# Patient Record
Sex: Female | Born: 1970 | Hispanic: Yes | Smoking: Former smoker
Health system: Southern US, Community
[De-identification: ages and names within clinical notes are randomized; demographics above are authoritative.]

## PROBLEM LIST (undated history)

## (undated) DIAGNOSIS — R519 Headache, unspecified: Secondary | ICD-10-CM

## (undated) DIAGNOSIS — D649 Anemia, unspecified: Secondary | ICD-10-CM

## (undated) DIAGNOSIS — R51 Headache: Secondary | ICD-10-CM

## (undated) DIAGNOSIS — I10 Essential (primary) hypertension: Secondary | ICD-10-CM

---

## 2001-04-03 ENCOUNTER — Emergency Department (HOSPITAL_COMMUNITY): Admission: EM | Admit: 2001-04-03 | Discharge: 2001-04-03 | Payer: Self-pay | Admitting: Emergency Medicine

## 2003-07-23 ENCOUNTER — Inpatient Hospital Stay (HOSPITAL_COMMUNITY): Admission: AD | Admit: 2003-07-23 | Discharge: 2003-07-25 | Payer: Self-pay | Admitting: Obstetrics

## 2004-01-27 ENCOUNTER — Inpatient Hospital Stay (HOSPITAL_COMMUNITY): Admission: AD | Admit: 2004-01-27 | Discharge: 2004-01-27 | Payer: Self-pay | Admitting: Obstetrics and Gynecology

## 2004-04-14 ENCOUNTER — Inpatient Hospital Stay (HOSPITAL_COMMUNITY): Admission: AD | Admit: 2004-04-14 | Discharge: 2004-04-14 | Payer: Self-pay | Admitting: Obstetrics & Gynecology

## 2004-07-23 ENCOUNTER — Inpatient Hospital Stay (HOSPITAL_COMMUNITY): Admission: AD | Admit: 2004-07-23 | Discharge: 2004-07-23 | Payer: Self-pay | Admitting: Obstetrics

## 2004-07-24 ENCOUNTER — Inpatient Hospital Stay (HOSPITAL_COMMUNITY): Admission: AD | Admit: 2004-07-24 | Discharge: 2004-07-30 | Payer: Self-pay | Admitting: *Deleted

## 2004-08-24 ENCOUNTER — Inpatient Hospital Stay (HOSPITAL_COMMUNITY): Admission: AD | Admit: 2004-08-24 | Discharge: 2004-08-27 | Payer: Self-pay | Admitting: Obstetrics

## 2005-05-22 ENCOUNTER — Encounter: Payer: Self-pay | Admitting: *Deleted

## 2005-05-22 ENCOUNTER — Inpatient Hospital Stay (HOSPITAL_COMMUNITY): Admission: AD | Admit: 2005-05-22 | Discharge: 2005-05-22 | Payer: Self-pay | Admitting: *Deleted

## 2005-08-23 ENCOUNTER — Ambulatory Visit (HOSPITAL_COMMUNITY): Admission: RE | Admit: 2005-08-23 | Discharge: 2005-08-23 | Payer: Self-pay | Admitting: *Deleted

## 2005-10-31 ENCOUNTER — Ambulatory Visit (HOSPITAL_COMMUNITY): Admission: RE | Admit: 2005-10-31 | Discharge: 2005-10-31 | Payer: Self-pay | Admitting: *Deleted

## 2006-01-02 ENCOUNTER — Ambulatory Visit: Payer: Self-pay | Admitting: Obstetrics and Gynecology

## 2006-01-02 ENCOUNTER — Inpatient Hospital Stay (HOSPITAL_COMMUNITY): Admission: AD | Admit: 2006-01-02 | Discharge: 2006-01-10 | Payer: Self-pay | Admitting: *Deleted

## 2006-01-02 ENCOUNTER — Encounter (INDEPENDENT_AMBULATORY_CARE_PROVIDER_SITE_OTHER): Payer: Self-pay | Admitting: *Deleted

## 2006-01-05 ENCOUNTER — Encounter (INDEPENDENT_AMBULATORY_CARE_PROVIDER_SITE_OTHER): Payer: Self-pay | Admitting: *Deleted

## 2006-01-15 ENCOUNTER — Ambulatory Visit: Payer: Self-pay | Admitting: Family Medicine

## 2006-01-22 ENCOUNTER — Ambulatory Visit: Payer: Self-pay | Admitting: Family Medicine

## 2006-01-25 ENCOUNTER — Ambulatory Visit: Payer: Self-pay | Admitting: Family Medicine

## 2006-01-30 ENCOUNTER — Ambulatory Visit: Payer: Self-pay | Admitting: Family Medicine

## 2006-02-01 ENCOUNTER — Ambulatory Visit: Payer: Self-pay | Admitting: Family Medicine

## 2006-02-08 ENCOUNTER — Ambulatory Visit: Payer: Self-pay | Admitting: Family Medicine

## 2006-02-14 ENCOUNTER — Ambulatory Visit: Payer: Self-pay | Admitting: Sports Medicine

## 2006-02-16 ENCOUNTER — Ambulatory Visit: Payer: Self-pay | Admitting: Family Medicine

## 2006-02-21 ENCOUNTER — Ambulatory Visit: Payer: Self-pay | Admitting: Sports Medicine

## 2006-03-09 ENCOUNTER — Ambulatory Visit: Payer: Self-pay

## 2006-03-27 ENCOUNTER — Ambulatory Visit: Payer: Self-pay | Admitting: Family Medicine

## 2006-04-02 ENCOUNTER — Encounter (INDEPENDENT_AMBULATORY_CARE_PROVIDER_SITE_OTHER): Payer: Self-pay | Admitting: *Deleted

## 2006-06-13 ENCOUNTER — Ambulatory Visit: Payer: Self-pay | Admitting: Sports Medicine

## 2006-07-12 ENCOUNTER — Ambulatory Visit: Payer: Self-pay | Admitting: Sports Medicine

## 2006-08-02 ENCOUNTER — Ambulatory Visit: Payer: Self-pay | Admitting: Family Medicine

## 2006-08-30 DIAGNOSIS — I80299 Phlebitis and thrombophlebitis of other deep vessels of unspecified lower extremity: Secondary | ICD-10-CM | POA: Insufficient documentation

## 2006-08-30 DIAGNOSIS — G4700 Insomnia, unspecified: Secondary | ICD-10-CM | POA: Insufficient documentation

## 2006-08-31 ENCOUNTER — Encounter (INDEPENDENT_AMBULATORY_CARE_PROVIDER_SITE_OTHER): Payer: Self-pay | Admitting: *Deleted

## 2006-10-16 ENCOUNTER — Ambulatory Visit: Payer: Self-pay | Admitting: Family Medicine

## 2006-10-16 DIAGNOSIS — F330 Major depressive disorder, recurrent, mild: Secondary | ICD-10-CM | POA: Insufficient documentation

## 2006-10-16 DIAGNOSIS — B351 Tinea unguium: Secondary | ICD-10-CM | POA: Insufficient documentation

## 2006-11-06 ENCOUNTER — Telehealth (INDEPENDENT_AMBULATORY_CARE_PROVIDER_SITE_OTHER): Payer: Self-pay | Admitting: Family Medicine

## 2006-11-09 ENCOUNTER — Telehealth (INDEPENDENT_AMBULATORY_CARE_PROVIDER_SITE_OTHER): Payer: Self-pay | Admitting: Family Medicine

## 2006-11-30 ENCOUNTER — Telehealth (INDEPENDENT_AMBULATORY_CARE_PROVIDER_SITE_OTHER): Payer: Self-pay | Admitting: Family Medicine

## 2006-12-06 ENCOUNTER — Ambulatory Visit: Payer: Self-pay | Admitting: Family Medicine

## 2006-12-06 LAB — CONVERTED CEMR LAB: KOH Prep: NEGATIVE

## 2006-12-12 ENCOUNTER — Encounter (INDEPENDENT_AMBULATORY_CARE_PROVIDER_SITE_OTHER): Payer: Self-pay | Admitting: Family Medicine

## 2006-12-12 ENCOUNTER — Ambulatory Visit (HOSPITAL_COMMUNITY): Admission: RE | Admit: 2006-12-12 | Discharge: 2006-12-12 | Payer: Self-pay | Admitting: *Deleted

## 2007-01-10 ENCOUNTER — Encounter (INDEPENDENT_AMBULATORY_CARE_PROVIDER_SITE_OTHER): Payer: Self-pay | Admitting: Family Medicine

## 2007-02-12 ENCOUNTER — Ambulatory Visit: Payer: Self-pay

## 2007-02-12 ENCOUNTER — Encounter (INDEPENDENT_AMBULATORY_CARE_PROVIDER_SITE_OTHER): Payer: Self-pay | Admitting: Family Medicine

## 2007-02-12 DIAGNOSIS — N912 Amenorrhea, unspecified: Secondary | ICD-10-CM | POA: Insufficient documentation

## 2007-02-12 LAB — CONVERTED CEMR LAB
Basophils Relative: 0 % (ref 0–1)
Beta hcg, urine, semiquantitative: POSITIVE
Eosinophils Absolute: 0 10*3/uL (ref 0.0–0.7)
Eosinophils Relative: 0 % (ref 0–5)
MCV: 88.7 fL (ref 78.0–100.0)
Monocytes Relative: 8 % (ref 3–11)
Neutro Abs: 3.8 10*3/uL (ref 1.7–7.7)
RBC: 4.32 M/uL (ref 3.87–5.11)
RDW: 13.7 % (ref 11.5–14.0)
Rh Type: POSITIVE
Rubella: 38.5 intl units/mL — ABNORMAL HIGH

## 2007-02-13 ENCOUNTER — Telehealth (INDEPENDENT_AMBULATORY_CARE_PROVIDER_SITE_OTHER): Payer: Self-pay | Admitting: *Deleted

## 2007-02-19 ENCOUNTER — Telehealth: Payer: Self-pay | Admitting: *Deleted

## 2007-02-20 ENCOUNTER — Telehealth: Payer: Self-pay | Admitting: *Deleted

## 2007-02-20 ENCOUNTER — Ambulatory Visit: Payer: Self-pay

## 2007-02-20 ENCOUNTER — Inpatient Hospital Stay (HOSPITAL_COMMUNITY): Admission: AD | Admit: 2007-02-20 | Discharge: 2007-02-20 | Payer: Self-pay | Admitting: Family Medicine

## 2007-02-20 DIAGNOSIS — O4690 Antepartum hemorrhage, unspecified, unspecified trimester: Secondary | ICD-10-CM | POA: Insufficient documentation

## 2007-02-22 ENCOUNTER — Inpatient Hospital Stay (HOSPITAL_COMMUNITY): Admission: AD | Admit: 2007-02-22 | Discharge: 2007-02-22 | Payer: Self-pay | Admitting: Obstetrics and Gynecology

## 2007-02-24 ENCOUNTER — Inpatient Hospital Stay (HOSPITAL_COMMUNITY): Admission: AD | Admit: 2007-02-24 | Discharge: 2007-02-24 | Payer: Self-pay | Admitting: Obstetrics & Gynecology

## 2007-03-02 ENCOUNTER — Inpatient Hospital Stay (HOSPITAL_COMMUNITY): Admission: AD | Admit: 2007-03-02 | Discharge: 2007-03-02 | Payer: Self-pay | Admitting: Family Medicine

## 2007-07-09 ENCOUNTER — Ambulatory Visit: Payer: Self-pay | Admitting: Family Medicine

## 2007-07-09 DIAGNOSIS — K029 Dental caries, unspecified: Secondary | ICD-10-CM | POA: Insufficient documentation

## 2007-09-06 ENCOUNTER — Encounter (INDEPENDENT_AMBULATORY_CARE_PROVIDER_SITE_OTHER): Payer: Self-pay | Admitting: Family Medicine

## 2008-01-20 ENCOUNTER — Emergency Department (HOSPITAL_COMMUNITY): Admission: EM | Admit: 2008-01-20 | Discharge: 2008-01-20 | Payer: Self-pay | Admitting: Emergency Medicine

## 2010-08-19 ENCOUNTER — Encounter: Payer: Self-pay | Admitting: *Deleted

## 2010-11-18 NOTE — Discharge Summary (Signed)
NAMEKIMMERLY, Angela Ruiz      ACCOUNT NO.:  [id number]   MEDICAL RECORD NO.:  [id number]          PATIENT TYPE:  INP   LOCATION:  9119                          FACILITY:  [facility]   PHYSICIAN:  Phil D. Okey Dupre, M.D.     DATE OF BIRTH:  1971-06-14   DATE OF ADMISSION:  01/02/2006  DATE OF DISCHARGE:  01/10/2006                                 DISCHARGE SUMMARY   This is a 40 year old, G7, P 6-0-1-6, female who presented at 6 and 4 days  gestational age on January 02, 2006.  She presented to the MAU, citing leakage  of fluid as the reason she came.  It was noted on speculum exam that she was  positive for pooling and ferning.  It was also noted she was fingertip and  50 on digital cervical exam.  The patient was noted to have, at this point  in her pregnancy, a malpresentation of a transverse lie.  Due to this  transverse lie, she was taken to the OR for a C-section.  The C-section was  performed on January 02, 2006, by Carolanne Grumbling, M.D., with attending surgeon  Dr. Kristen Loader.  It was a repeat low transverse cesarean section for a  malpresentation with baby in transverse lie, previous spontaneous rupture of  membranes.  This female also had two previous C-sections, followed by three  successful vaginal births after previous C-section.  Please see operative  note for more information on the C-section.  This patient, after the  section, produced a female infant in transverse lie, Apgars 8 and 9, cord pH  of 7.25, no complications.  On postoperative day number three, I was  notified by the nurse that the patient had a new onset murmur and leg pain.  Upon examining the patient, I did appreciate a heart murmur, however it was  most likely a normal physiological murmur.  However, this patient, on  physical exam, did have right lower extremity tenderness.  There was no  erythema, no warmth, and minor swelling of her left foot.  The following day  the tenderness increased, as did the swelling, and on  January 05, 2006, a  Doppler ultrasound was performed.  I was notified by vascular lab that there  was indeed a right lower extremity DVT noted in the posterior tibial vein.  All other veins were patent.  At this time we were notified of the DVT.  The  patient was begun on Lovenox and Coumadin per the pharmacy protocol; Lovenox  70 mg subcutaneously b.i.d. and Coumadin 5 mg p.o. daily.  We also began  tracking the patient's INR.  The initial INR of the patient was 1.2, on January 08, 2006, which was the first check of her INR.  On January 09, 2006, INR had  increased to 1.8.  At this point, pharmacy increased the Coumadin to 7.5 mg  p.o. daily.  On January 10, 2006, the patient had a therapeutic INR of 2.2,  with therapeutic levels being between 2 and 3.  At this time, the patient's  calf tenderness had subsided.  In addition, during this stay she complained  of severe  abdominal pain, which has also decreased over the course.  The  patient will be set up in Coumadin clinic pending the return of Dr. Michaell Cowing,  who is in chart of that clinic.  In the meantime, the patient will return on  Friday, January 12, 2006, to the MAU, to have an INR check.  She was given a  prescription with that order written on it, and will present on Friday to  the MAU.  On that order, there is a note that I, Dr. Karn Pickler, will be  notified of her INR level at that time.  If the INR is nontherapeutic, above  or below the 2-3 range, I will contact pharmacy, and we will adjust her  Coumadin dose as needed.  On Monday, January 15, 2006, the patient will follow  up with me, Dr. Karn Pickler, at the [facility].  Again, her INR will  be checked, and her Coumadin dose will be adjusted as needed.  This will  establish her a primary care physician.  From there we hopefully will set  her up with Coumadin clinic where she will be followed with her INR levels.  Ms. Melba Coon will also follow up with [facility] for a typical 6-week  follow up  appointment.  She is in the process of deciding if she can get a  payment plan for a BTL.  If not, she will have an IUD placed at that 6-week  follow up appointment at [facility].  I, Dr. Karn Pickler, will be paged with  the results as the patient waits for them.  The patient was discharged on  Wednesday, January 10, 2006, with a prescription for Coumadin 5 mg p.o. daily,  Percocet 325/5 one pill q.4-6 hours p.r.n. pain, and also Colace and  prenatal vitamins.  She was instructed to call if she has more leg pain  after discharge.  The patient's hemoglobin on discharge is 11.5.  She is  breast and bottle feeding.  Please note that, also during the stay, after  the DVT was found on duplex ultrasound, the patient did have a CT scan of  her chest to rule out a pulmonary embolism.  That CT was negative for any  kind of pulmonary embolism.  The patient did not have any more symptoms of a  PE, and upon discharge she was feeling well.  In conclusion, this patient  was discharged with a diagnosis of postoperative day number three DVT.  Also, she  had a C-section.  Please note the dictation did not pick up that her follow  up appointment with me is on January 15, 2006, Monday, at 3:30 p.m., with me,  Dr. Karn Pickler.  Please that the C-section was with no complications.  It was a  little baby boy.     ______________________________  Johney Maine, M.D.    ______________________________  Javier Glazier. Okey Dupre, M.D.    JT/MEDQ  D:  01/10/2006  T:  01/10/2006  Job:  [id number]   cc:   Michele Mcalpine D. Okey Dupre, M.D.

## 2010-11-18 NOTE — Discharge Summary (Signed)
Angela Ruiz, Angela Ruiz NO.:  [id number]   MEDICAL RECORD NO.:  [id number]          PATIENT TYPE:  INP   LOCATION:  9149                          FACILITY:  [facility]   PHYSICIAN:  Roseanna Rainbow, M.D.DATE OF BIRTH:  1970-12-23   DATE OF ADMISSION:  07/24/2004  DATE OF DISCHARGE:  07/30/2004                                 DISCHARGE SUMMARY   CHIEF COMPLAINT:  The patient is a 40 year old gravida 6, para 4-0-1-4, with  an estimated date of confinement of August 15, 2004, who complains of  fever.   HISTORY OF PRESENT ILLNESS:  The patient was recently treated one day prior  to presentation for a urinary tract infection with Macrobid.   PHYSICAL EXAMINATION:  VITAL SIGNS:  TMAX 101.7, pulse 150, fetal heart rate  190.  BACK:  Right costovertebral angle tenderness.  HEENT:  Normocephalic and atraumatic.  LUNGS:  Clear to auscultation bilaterally.  HEART:  Regular rate and rhythm without murmurs, rubs, or gallops.  ABDOMEN:  The fundal height is 36 cm.  EXTREMITIES:  No cyanosis, clubbing, or edema.   ASSESSMENT:  Intrauterine pregnancy at 35+ weeks with pyelonephritis.   PLAN:  Admission, parenteral antibiotics, supportive measures.   HOSPITAL COURSE:  The patient was admitted and she was started on parenteral  antibiotics.  Blood cultures initially grew out gram negative rods.  She was  felt to have urosepsis.  Infectious disease was consulted as well as  critical care.  The final culture and sensitivity for the blood cultures  were Escherichia coli sensitive to the ceftriaxone that the patient was  receiving for treatment.  Her hemoglobin was 8.8.  Chest x-ray with findings  consistent with a hazy pneumonia.  Renal ultrasound was normal.  At this  point, she was felt to have urosepsis complicated by pulmonary edema.  However, she was not felt to be in any respiratory distress.  Her oxygen  saturations remained greater than 99% on 2 liters nasal cannula.  Clinically, she improved.  She was subsequently discharged to home.   DISCHARGE DIAGNOSES:  1.  Intrauterine pregnancy at 35+ weeks.  2.  Urosepsis.   CONDITION ON DISCHARGE:  Stable.   DIET:  Regular.   ACTIVITY:  No strenuous activity.   DISCHARGE MEDICATIONS:  Ceftin.   DISPOSITION:  The patient is to follow up in the office in several days.      LAJ/MEDQ  D:  08/23/2004  T:  08/23/2004  Job:  [id number]

## 2010-11-18 NOTE — Op Note (Signed)
Angela Ruiz, Angela Ruiz      ACCOUNT NO.:  [id number]   MEDICAL RECORD NO.:  [id number]          PATIENT TYPE:  INP   LOCATION:  9119                          [facility]ACILITY:  [facility]   PHYSICIAN:  Tracy L. Mayford Knife, M.D.DATE OF BIRTH:  March 31, 1971   DATE OF PROCEDURE:  DATE OF DISCHARGE:                                 OPERATIVE REPORT   PREOPERATIVE DIAGNOSES:  1.  A 39-week 4-day intrauterine pregnancy.  2.  Malpresentation with baby with transverse lie.  3.  Spontaneous rupture of membranes.  4.  History of two previous cesarean sections followed by three successful      vaginal births after previous cesarean section.   POSTOPERATIVE DIAGNOSES:  1.  A 39-week and 4-day intrauterine pregnancy.  2.  Malpresentation with baby with transverse lie.  3.  Spontaneous rupture of membranes.  4.  History of two previous cesarean sections followed by three successful      vaginal births after previous cesarean section.   PROCEDURE:  Repeat low transverse cesarean section via Pfannenstiel.   SURGEON:  Javier Glazier. Okey Dupre, M.D.   ASSISTANT:  Marc Morgans. Mayford Knife, M.D.   ANESTHESIA:  Spinal.   COMPLICATIONS:  None.   ESTIMATED BLOOD LOSS:  600 mL.   FLUIDS:  2000 mL of lactated Ringer's.   URINE OUTPUT:  400 mL clear urine.   INDICATIONS:  The patient is a 40 year old G7, P5-0-1-5, at 39 weeks 4 days  well-healed presented with spontaneous rupture of membranes.  Baby was noted  to not be cephalic.  On exam, the baby was found to be in transverse lie  with the head on the maternal right.  The patient was counseled about the  risks and benefits of cesarean section.  She wanted to proceed.   FINDINGS:  Female infant in the transverse lie with abdomen down and head to  the maternal right.  Apgars 8 and 9.  Cord pH 7.25.  Normal uterus, tubes,  and ovaries.   PROCEDURE:  The patient was taken to the operating room where spinal  anesthesia was placed and found to be adequate.  She was then prepped  and  draped in normal sterile fashion in the dorsal supine position with a  leftward tilt.  A Pfannenstiel skin incision was then made with the scalpel  and carried through to the underlying layer of fascia.  Fascia was incised  in the midline and the incision extended laterally with the Mayo scissors.  Inferior aspect of the fascial incision was then grasped with Kocher clamps,  elevated, and underlying rectus muscles dissected off bluntly.  Attention  was then turned to the superior aspect of the incision which, in a similar  fashion, was grasped, tented up with Kocher clamps, and the rectus muscles  dissected off bluntly.  Rectus muscles were then separated in the midline  and the peritoneum identified, tented up, and entered with the Metzenbaum  scissors.  Peritoneal incision was extended superiorly and inferiorly with  good visualization of the bladder.  Bladder blade was then inserted and the  vesicouterine peritoneum identified, grasped with pickups, and entered  sharply with the Metzenbaum scissors.  Incision  was extended laterally and  the bladder flap created digitally.  Bladder blade was then reinserted in  the lower uterine segment and incised in a transverse fashion with the  scalpel.  The uterine incision was extended laterally manually.  Bladder  blade was removed and the infant initially presented with a transverse lie  with the abdomen down and to the right.  Ultimately, a breech extraction was  performed (double footling).  Both feet were removed and then the body  followed by ultimately the infant's head which was also delivered  atraumatically.  The nose and mouth were suctioned and the cord clamped and  cut.  The infant was handed off to the waiting neonatologist.  Cord gases  were sent.  Uterus was not exteriorized but it was cleared of all clot and  debris.  Uterine incision was repaired with 0 Vicryl in running locked  fashion.  There was bleeding from the left  corner so additional 0 Vicryl was  used to obtain excellent hemostasis.  Gutters were cleared of all clot.  The  fascia was reapproximated with 0 Vicryl in a running fashion.  The skin was  closed with staples.   The patient tolerated the procedure well.  Sponge, lap and needle counts  were correct x 2.  The patient was taken to the recovery room in stable  condition.           ______________________________  Marc Morgans Mayford Knife, M.D.     TLW/MEDQ  D:  01/02/2006  T:  01/02/2006  Job:  [id number]

## 2011-01-26 ENCOUNTER — Emergency Department (HOSPITAL_COMMUNITY)
Admission: EM | Admit: 2011-01-26 | Discharge: 2011-01-26 | Disposition: A | Discharge status: Home / Self Care | Payer: Self-pay | Attending: Emergency Medicine | Admitting: Emergency Medicine

## 2011-01-26 DIAGNOSIS — R6884 Jaw pain: Secondary | ICD-10-CM | POA: Insufficient documentation

## 2011-01-26 DIAGNOSIS — H9209 Otalgia, unspecified ear: Secondary | ICD-10-CM | POA: Insufficient documentation

## 2011-01-26 DIAGNOSIS — R51 Headache: Secondary | ICD-10-CM | POA: Insufficient documentation

## 2011-01-26 DIAGNOSIS — K089 Disorder of teeth and supporting structures, unspecified: Secondary | ICD-10-CM | POA: Insufficient documentation

## 2011-04-14 LAB — DIFFERENTIAL
Basophils Absolute: 0.1
Monocytes Absolute: 0.5
Monocytes Relative: 6

## 2011-04-14 LAB — URINALYSIS, ROUTINE W REFLEX MICROSCOPIC
Bilirubin Urine: NEGATIVE
Nitrite: NEGATIVE
Protein, ur: NEGATIVE
Urobilinogen, UA: 0.2

## 2011-04-14 LAB — ABO/RH: ABO/RH(D): A POS

## 2011-04-14 LAB — GC/CHLAMYDIA PROBE AMP, GENITAL: GC Probe Amp, Genital: NEGATIVE

## 2011-04-14 LAB — CBC
HCT: 37.8
Hemoglobin: 13.1
MCHC: 34.7
MCV: 86.8

## 2011-04-14 LAB — URINE MICROSCOPIC-ADD ON

## 2011-04-14 LAB — URINE CULTURE

## 2011-04-14 LAB — WET PREP, GENITAL: Yeast Wet Prep HPF POC: NONE SEEN

## 2013-06-13 ENCOUNTER — Encounter (INDEPENDENT_AMBULATORY_CARE_PROVIDER_SITE_OTHER): Payer: Self-pay

## 2013-06-13 ENCOUNTER — Encounter: Payer: Self-pay | Admitting: Internal Medicine

## 2013-06-13 ENCOUNTER — Ambulatory Visit: Payer: Self-pay | Attending: Internal Medicine | Admitting: Internal Medicine

## 2013-06-13 VITALS — BP 133/90 | HR 70 | Temp 98.7°F | Resp 14 | Ht 60.0 in | Wt 159.0 lb

## 2013-06-13 DIAGNOSIS — N920 Excessive and frequent menstruation with regular cycle: Secondary | ICD-10-CM | POA: Insufficient documentation

## 2013-06-13 LAB — LIPID PANEL
Cholesterol: 198 mg/dL (ref 0–200)
HDL: 59 mg/dL (ref >39)
LDL Cholesterol: 107 mg/dL — ABNORMAL HIGH (ref 0–99)
Triglycerides: 161 mg/dL — ABNORMAL HIGH (ref <150)

## 2013-06-13 LAB — ANEMIA PANEL
Folate: >20 ng/mL
RBC.: 4.69 MIL/uL (ref 3.87–5.11)
Retic Ct Pct: 0.8 % (ref 0.4–2.3)
TIBC: 542 ug/dL — ABNORMAL HIGH (ref 250–470)
UIBC: 529 ug/dL — ABNORMAL HIGH (ref 125–400)
Vitamin B-12: 525 pg/mL (ref 211–911)

## 2013-06-13 LAB — CBC WITH DIFFERENTIAL/PLATELET
Basophils Relative: 1 % (ref 0–1)
Eosinophils Absolute: 0 10*3/uL (ref 0.0–0.7)
Eosinophils Relative: 0 % (ref 0–5)
Hemoglobin: 8.6 g/dL — ABNORMAL LOW (ref 12.0–15.0)
Lymphocytes Relative: 42 % (ref 12–46)
MCH: 18.3 pg — ABNORMAL LOW (ref 26.0–34.0)
Monocytes Absolute: 0.5 10*3/uL (ref 0.1–1.0)
Neutro Abs: 4.1 10*3/uL (ref 1.7–7.7)
Platelets: 445 10*3/uL — ABNORMAL HIGH (ref 150–400)
RDW: 18.9 % — ABNORMAL HIGH (ref 11.5–15.5)

## 2013-06-13 LAB — COMPLETE METABOLIC PANEL WITH GFR
Albumin: 4.5 g/dL (ref 3.5–5.2)
Alkaline Phosphatase: 61 U/L (ref 39–117)
Calcium: 9.1 mg/dL (ref 8.4–10.5)
Creat: 0.55 mg/dL (ref 0.50–1.10)
Glucose, Bld: 99 mg/dL (ref 70–99)
Potassium: 4.6 mEq/L (ref 3.5–5.3)
Sodium: 139 mEq/L (ref 135–145)

## 2013-06-13 NOTE — Progress Notes (Signed)
Pt is here to establish care. Complains that menstrual period lasts 20 days; abdominal pain, bloating, heavy bleeding on first 3-4 days. Pian occuring x3 weeks. Negative pregnancy test.

## 2013-06-13 NOTE — Progress Notes (Signed)
Patient ID: Angela Ruiz, female   DOB: 1970-11-06, 42 y.o.   MRN: [id number]   CC:  HPI: 42 year old female, here to establish care. The patient has a history of DVT after her last pregnancy 7 years ago. She was placed on Lovenox and Coumadin for this. She describes heavy menstrual periods her last period lasted 20 days. The patient also complains of pelvic pain and dysmenorrhea especially during her periods. She also complains of bilateral lower quadrant pain. Her bowel movements are regular. She denies any chest pain or shortness of breath In the morning when she wakes up she is a little dizzy. Last night she had a headache She does not typically have a history of migraines    Not on File History reviewed. No pertinent past medical history. Current Outpatient Prescriptions on File Prior to Visit  Medication Sig Dispense Refill  . cephALEXin (KEFLEX) 500 MG capsule Take 500 mg by mouth 2 (two) times daily. For seven days       . guaiFENesin-dextromethorphan (ROBITUSSIN DM) 100-10 MG/5ML syrup Take 5 mLs by mouth. (one teaspoon) every 4-6 hours as needed for cough       . oxymetazoline (AFRIN) 0.05 % nasal spray 2 sprays by Nasal route 3 (three) times daily. For 3 days       . terbinafine (LAMISIL) 250 MG tablet Take 250 mg by mouth daily.         No current facility-administered medications on file prior to visit.   History reviewed. No pertinent family history. History   Social History  . Marital Status: Single    Spouse Name: N/A    Number of Children: N/A  . Years of Education: N/A   Occupational History  . Not on file.   Social History Main Topics  . Smoking status: Never Smoker   . Smokeless tobacco: Not on file  . Alcohol Use: No  . Drug Use: No  . Sexual Activity: Not on file   Other Topics Concern  . Not on file   Social History Narrative  . No narrative on file    Review of Systems  Constitutional: Negative for fever, chills, diaphoresis,  activity change, appetite change and fatigue.  HENT: Negative for ear pain, nosebleeds, congestion, facial swelling, rhinorrhea, neck pain, neck stiffness and ear discharge.   Eyes: Negative for pain, discharge, redness, itching and visual disturbance.  Respiratory: Negative for cough, choking, chest tightness, shortness of breath, wheezing and stridor.   Cardiovascular: Negative for chest pain, palpitations and leg swelling.  Gastrointestinal: Negative for abdominal distention.  Genitourinary: Negative for dysuria, urgency, frequency, hematuria, flank pain, decreased urine volume, difficulty urinating and dyspareunia.  Musculoskeletal: Negative for back pain, joint swelling, arthralgias and gait problem.  Neurological: Negative for dizziness, tremors, seizures, syncope, facial asymmetry, speech difficulty, weakness, light-headedness, numbness and headaches.  Hematological: Negative for adenopathy. Does not bruise/bleed easily.  Psychiatric/Behavioral: Negative for hallucinations, behavioral problems, confusion, dysphoric mood, decreased concentration and agitation.    Objective:   Filed Vitals:   06/13/13 0909  BP: 133/90  Pulse: 70  Temp: 98.7 F (37.1 C)  Resp: 14    Physical Exam  Constitutional: Appears well-developed and well-nourished. No distress.  HENT: Normocephalic. External right and left ear normal. Oropharynx is clear and moist.  Eyes: Conjunctivae and EOM are normal. PERRLA, no scleral icterus.  Neck: Normal ROM. Neck supple. No JVD. No tracheal deviation. No thyromegaly.  CVS: RRR, S1/S2 +, no murmurs, no gallops, no carotid bruit.  Pulmonary:  Effort and breath sounds normal, no stridor, rhonchi, wheezes, rales.  Abdominal: Soft. BS +,  no distension, tenderness, rebound or guarding.  Musculoskeletal: Normal range of motion. No edema and no tenderness.  Lymphadenopathy: No lymphadenopathy noted, cervical, inguinal. Neuro: Alert. Normal reflexes, muscle tone  coordination. No cranial nerve deficit. Skin: Skin is warm and dry. No rash noted. Not diaphoretic. No erythema. No pallor.  Psychiatric: Normal mood and affect. Behavior, judgment, thought content normal.   Lab Results  Component Value Date   WBC 7.4 02/20/2007   HGB 13.1 02/20/2007   HCT 37.8 02/20/2007   MCV 86.8 02/20/2007   PLT 308 02/20/2007   No results found for this basename: CREATININE, BUN, NA, K, CL, CO2    No results found for this basename: HGBA1C   Lipid Panel  No results found for this basename: chol, trig, hdl, cholhdl, vldl, ldlcalc       Assessment and plan:   Patient Active Problem List   Diagnosis Date Noted  . DENTAL CARIES 07/09/2007  . VAGINAL BLEEDING, FIRST TRIMESTER 02/20/2007  . AMENORRHEA 02/12/2007  . ONYCHOMYCOSIS, TOENAILS 10/16/2006  . DEPRESSIVE DISORDER, MAJOR, RCR, MILD 10/16/2006  . DEEP VEIN THROMBOPHLEBITIS, LEG 08/30/2006  . INSOMNIA NOS 08/30/2006     Menorrhagia Irregular periods for the last one year, now increasing in duration Dizziness could be because of anemia We'll check the patient's hemoglobin Pelvic ultrasound Gynecologic referral  Establish care Patient is refusing flu vaccine and she is allergic to eggs. She has not had a recent Pap smear Screening labs, CMP, CBC, and lipid panel Followup in one month for pelvic pain and heavy menstrual bleeding             The patient was given clear instructions to go to ER or return to medical center if symptoms don't improve, worsen or new problems develop. The patient verbalized understanding. The patient was told to call to get any lab results if not heard anything in the next week.

## 2013-06-14 LAB — VITAMIN D 25 HYDROXY (VIT D DEFICIENCY, FRACTURES): Vit D, 25-Hydroxy: 21 ng/mL — ABNORMAL LOW (ref 30–89)

## 2013-06-17 ENCOUNTER — Encounter: Payer: Self-pay | Admitting: Nurse Practitioner

## 2013-06-17 ENCOUNTER — Telehealth: Payer: Self-pay | Admitting: *Deleted

## 2013-06-17 MED ORDER — FERROUS SULFATE 325 (65 FE) MG PO TABS: 325.0000 mg | ORAL_TABLET | Freq: Three times a day (TID) | ORAL | Status: DC

## 2013-06-17 MED ORDER — VITAMIN D (ERGOCALCIFEROL) 1.25 MG (50000 UNIT) PO CAPS: 50000.0000 [IU] | ORAL_CAPSULE | ORAL | Status: DC

## 2013-06-17 NOTE — Telephone Encounter (Signed)
Contacted pt to inform her urgently that her hemoglobin is 8.6, 6 years ago her hemoglobin was 13.1. She is also extremely iron deficient. Prescription has been called in for ferrous sulfate. She also has a very low vitamin D level. Prescription called in for vitamin D [facility]. He scheduled the patient for repeat CBC in one month. And a followup visit in the clinic in one month. Spoke with her son Wynona Canes because pt does not speak English effectively. Call completed effectiviely through pt's son.

## 2013-06-17 NOTE — Addendum Note (Signed)
Addended by: Susie Cassette MD, Germain Osgood on: 06/17/2013 04:22 PM   Modules accepted: Orders

## 2013-06-19 ENCOUNTER — Ambulatory Visit (HOSPITAL_COMMUNITY): Payer: Self-pay

## 2013-06-30 ENCOUNTER — Other Ambulatory Visit: Payer: Self-pay | Admitting: Internal Medicine

## 2013-06-30 ENCOUNTER — Ambulatory Visit (HOSPITAL_COMMUNITY)
Admission: RE | Admit: 2013-06-30 | Discharge: 2013-06-30 | Disposition: A | Discharge status: Home / Self Care | Payer: Self-pay | Source: Ambulatory Visit | Attending: Internal Medicine | Admitting: Internal Medicine

## 2013-06-30 DIAGNOSIS — R109 Unspecified abdominal pain: Secondary | ICD-10-CM | POA: Insufficient documentation

## 2013-06-30 DIAGNOSIS — N920 Excessive and frequent menstruation with regular cycle: Secondary | ICD-10-CM

## 2013-06-30 DIAGNOSIS — D250 Submucous leiomyoma of uterus: Secondary | ICD-10-CM | POA: Insufficient documentation

## 2013-07-14 ENCOUNTER — Encounter: Payer: Self-pay | Admitting: Obstetrics & Gynecology

## 2013-07-14 ENCOUNTER — Ambulatory Visit: Payer: Self-pay | Attending: Internal Medicine | Admitting: Internal Medicine

## 2013-07-14 ENCOUNTER — Encounter: Payer: Self-pay | Admitting: Nurse Practitioner

## 2013-07-14 VITALS — BP 120/57 | HR 81 | Temp 98.7°F | Resp 14 | Ht 60.0 in | Wt 163.8 lb

## 2013-07-14 DIAGNOSIS — D62 Acute posthemorrhagic anemia: Secondary | ICD-10-CM

## 2013-07-14 DIAGNOSIS — R300 Dysuria: Secondary | ICD-10-CM | POA: Insufficient documentation

## 2013-07-14 DIAGNOSIS — D649 Anemia, unspecified: Secondary | ICD-10-CM | POA: Insufficient documentation

## 2013-07-14 DIAGNOSIS — N920 Excessive and frequent menstruation with regular cycle: Secondary | ICD-10-CM | POA: Insufficient documentation

## 2013-07-14 LAB — CBC WITH DIFFERENTIAL/PLATELET
BASOS PCT: 1 % (ref 0–1)
Basophils Absolute: 0 10*3/uL (ref 0.0–0.1)
EOS ABS: 0 10*3/uL (ref 0.0–0.7)
EOS PCT: 1 % (ref 0–5)
HEMATOCRIT: 28 % — AB (ref 36.0–46.0)
HEMOGLOBIN: 8.3 g/dL — AB (ref 12.0–15.0)
Lymphocytes Relative: 45 % (ref 12–46)
Lymphs Abs: 2.8 10*3/uL (ref 0.7–4.0)
MCH: 18.9 pg — ABNORMAL LOW (ref 26.0–34.0)
MCHC: 29.6 g/dL — AB (ref 30.0–36.0)
MCV: 63.9 fL — AB (ref 78.0–100.0)
MONO ABS: 0.4 10*3/uL (ref 0.1–1.0)
MONOS PCT: 7 % (ref 3–12)
Neutro Abs: 3 10*3/uL (ref 1.7–7.7)
Neutrophils Relative %: 46 % (ref 43–77)
Platelets: 475 10*3/uL — ABNORMAL HIGH (ref 150–400)
RBC: 4.38 MIL/uL (ref 3.87–5.11)
RDW: 19.2 % — ABNORMAL HIGH (ref 11.5–15.5)
WBC: 6.3 10*3/uL (ref 4.0–10.5)

## 2013-07-14 LAB — POCT URINALYSIS DIPSTICK
BILIRUBIN UA: NEGATIVE
Glucose, UA: NEGATIVE
KETONES UA: NEGATIVE
Leukocytes, UA: NEGATIVE
Nitrite, UA: NEGATIVE
PH UA: 6
Protein, UA: NEGATIVE
Urobilinogen, UA: 0.2

## 2013-07-14 LAB — TSH: TSH: 1.667 u[IU]/mL (ref 0.350–4.500)

## 2013-07-14 MED ORDER — FERROUS SULFATE 325 (65 FE) MG PO TABS: 325.0000 mg | ORAL_TABLET | Freq: Three times a day (TID) | ORAL | Status: DC

## 2013-07-14 NOTE — Progress Notes (Signed)
Patient ID: Angela Ruiz, female   DOB: [birth year], 43 y.o.   MRN: [id number]   CC:  HPI: Patient presents with anemia , she has not started her iron prescriptions yet, she still has SOB due to anemia , no chest pain,  She complains of dysmenorrhea.She complains of dysuria and  Wants a UA .  Not on File No past medical history on file. Current Outpatient Prescriptions on File Prior to Visit  Medication Sig Dispense Refill  . Vitamin D, Ergocalciferol, (DRISDOL) 50000 UNITS CAPS capsule Take 1 capsule (50,000 Units total) by mouth every 7 (seven) days.  30 capsule  0  . cephALEXin (KEFLEX) 500 MG capsule Take 500 mg by mouth 2 (two) times daily. For seven days       . guaiFENesin-dextromethorphan (ROBITUSSIN DM) 100-10 MG/5ML syrup Take 5 mLs by mouth. (one teaspoon) every 4-6 hours as needed for cough       . oxymetazoline (AFRIN) 0.05 % nasal spray 2 sprays by Nasal route 3 (three) times daily. For 3 days       . terbinafine (LAMISIL) 250 MG tablet Take 250 mg by mouth daily.         No current facility-administered medications on file prior to visit.   No family history on file. History   Social History  . Marital Status: Married    Spouse Name: N/A    Number of Children: N/A  . Years of Education: N/A   Occupational History  . Not on file.   Social History Main Topics  . Smoking status: Never Smoker   . Smokeless tobacco: Not on file  . Alcohol Use: No  . Drug Use: No  . Sexual Activity: Not on file   Other Topics Concern  . Not on file   Social History Narrative  . No narrative on file    Review of Systems  Constitutional: as in HPI  HENT: Negative for ear pain, nosebleeds, congestion, facial swelling, rhinorrhea, neck pain, neck stiffness and ear discharge.   Eyes: Negative for pain, discharge, redness, itching and visual disturbance.  Respiratory: Negative for cough, choking, chest tightness, shortness of breath, wheezing and stridor.    Cardiovascular: Negative for chest pain, palpitations and leg swelling.  Gastrointestinal: Negative for abdominal distention.  Genitourinary: Negative for dysuria, urgency, frequency, hematuria, flank pain, decreased urine volume, difficulty urinating and dyspareunia.  Musculoskeletal: Negative for back pain, joint swelling, arthralgias and gait problem.  Neurological: Negative for dizziness, tremors, seizures, syncope, facial asymmetry, speech difficulty, weakness, light-headedness, numbness and headaches.  Hematological: Negative for adenopathy. Does not bruise/bleed easily.  Psychiatric/Behavioral: Negative for hallucinations, behavioral problems, confusion, dysphoric mood, decreased concentration and agitation.    Objective:   Filed Vitals:   07/14/13 0914  BP: 120/57  Pulse: 81  Temp: 98.7 F (37.1 C)  Resp: 14    Physical Exam  Constitutional: Appears well-developed and well-nourished. No distress.  HENT: Normocephalic. External right and left ear normal. Oropharynx is clear and moist.  Eyes: Conjunctivae and EOM are normal. PERRLA, no scleral icterus.  Neck: Normal ROM. Neck supple. No JVD. No tracheal deviation. No thyromegaly.  CVS: RRR, S1/S2 +, no murmurs, no gallops, no carotid bruit.  Pulmonary: Effort and breath sounds normal, no stridor, rhonchi, wheezes, rales.  Abdominal: Soft. BS +,  no distension, tenderness, rebound or guarding.  Musculoskeletal: Normal range of motion. No edema and no tenderness.  Lymphadenopathy: No lymphadenopathy noted, cervical, inguinal. Neuro: Alert. Normal reflexes, muscle tone coordination. No cranial  nerve deficit. Skin: Skin is warm and dry. No rash noted. Not diaphoretic. No erythema. No pallor.  Psychiatric: Normal mood and affect. Behavior, judgment, thought content normal.   Lab Results  Component Value Date   WBC 8.1 06/13/2013   HGB 8.6* 06/13/2013   HCT 29.2* 06/13/2013   MCV 62.3* 06/13/2013   PLT 445* 06/13/2013    Lab Results  Component Value Date   CREATININE 0.55 06/13/2013   BUN 10 06/13/2013   NA 139 06/13/2013   K 4.6 06/13/2013   CL 105 06/13/2013   CO2 22 06/13/2013    No results found for this basename: HGBA1C   Lipid Panel     Component Value Date/Time   CHOL 198 06/13/2013 0946   TRIG 161* 06/13/2013 0946   HDL 59 06/13/2013 0946   CHOLHDL 3.4 06/13/2013 0946   VLDL 32 06/13/2013 0946   LDLCALC 107* 06/13/2013 0946       Assessment and plan:   Patient Active Problem List   Diagnosis Date Noted  . DENTAL CARIES 07/09/2007  . VAGINAL BLEEDING, FIRST TRIMESTER 02/20/2007  . AMENORRHEA 02/12/2007  . ONYCHOMYCOSIS, TOENAILS 10/16/2006  . DEPRESSIVE DISORDER, MAJOR, RCR, MILD 10/16/2006  . [place], LEG 08/30/2006  . INSOMNIA NOS 08/30/2006       Dysuria  Will obtain UA    Anemia due to menorrhagia Gynecology referral, CBC  TSH  Follow in 3 months     The patient was given clear instructions to go to ER or return to medical center if symptoms don't improve, worsen or new problems develop. The patient verbalized understanding. The patient was told to call to get any lab results if not heard anything in the next week.

## 2013-07-14 NOTE — Progress Notes (Signed)
Pt is here f/u. Requests results from ultrasound. Pt requests a urinalysis for heavy menstrual bleeding. Feels like she has an infection; slight burning after menstrual period ends, itching, and excessive urination.

## 2013-07-15 LAB — VITAMIN D 25 HYDROXY (VIT D DEFICIENCY, FRACTURES): Vit D, 25-Hydroxy: 27 ng/mL — ABNORMAL LOW (ref 30–89)

## 2013-07-21 ENCOUNTER — Telehealth: Payer: Self-pay | Admitting: *Deleted

## 2013-07-21 NOTE — Telephone Encounter (Signed)
Contacted pt with the interpreter line. Call completed effectively.

## 2013-07-21 NOTE — Telephone Encounter (Signed)
Message copied by Lacinda Curvin, [place] R on Mon Jul 21, 2013 10:12 AM ------      Message from: Allyson Sabal MD, [facility]      Created: Fri Jul 18, 2013  3:02 PM       Notify patient that hemoglobin is still low. She should continue taking her iron and follow up with gynecology ------

## 2013-09-11 ENCOUNTER — Ambulatory Visit: Payer: Self-pay | Admitting: Internal Medicine

## 2014-09-22 ENCOUNTER — Inpatient Hospital Stay (HOSPITAL_COMMUNITY)
Admission: AD | Admit: 2014-09-22 | Discharge: 2014-09-22 | Disposition: A | Discharge status: Home / Self Care | Payer: Self-pay | Source: Ambulatory Visit | Attending: Family Medicine | Admitting: Family Medicine

## 2014-09-22 ENCOUNTER — Encounter (HOSPITAL_COMMUNITY): Payer: Self-pay | Admitting: *Deleted

## 2014-09-22 DIAGNOSIS — B3731 Acute candidiasis of vulva and vagina: Secondary | ICD-10-CM

## 2014-09-22 DIAGNOSIS — B373 Candidiasis of vulva and vagina: Secondary | ICD-10-CM | POA: Insufficient documentation

## 2014-09-22 DIAGNOSIS — R350 Frequency of micturition: Secondary | ICD-10-CM | POA: Insufficient documentation

## 2014-09-22 DIAGNOSIS — R229 Localized swelling, mass and lump, unspecified: Secondary | ICD-10-CM | POA: Insufficient documentation

## 2014-09-22 DIAGNOSIS — N9489 Other specified conditions associated with female genital organs and menstrual cycle: Secondary | ICD-10-CM

## 2014-09-22 DIAGNOSIS — I10 Essential (primary) hypertension: Secondary | ICD-10-CM | POA: Insufficient documentation

## 2014-09-22 DIAGNOSIS — R109 Unspecified abdominal pain: Secondary | ICD-10-CM | POA: Insufficient documentation

## 2014-09-22 HISTORY — DX: Essential (primary) hypertension: I10

## 2014-09-22 LAB — POCT PREGNANCY, URINE: PREG TEST UR: NEGATIVE

## 2014-09-22 LAB — URINE MICROSCOPIC-ADD ON

## 2014-09-22 LAB — URINALYSIS, ROUTINE W REFLEX MICROSCOPIC
Bilirubin Urine: NEGATIVE
GLUCOSE, UA: NEGATIVE mg/dL
Ketones, ur: NEGATIVE mg/dL
LEUKOCYTES UA: NEGATIVE
Nitrite: NEGATIVE
PH: 5.5 (ref 5.0–8.0)
Protein, ur: NEGATIVE mg/dL
Specific Gravity, Urine: >1.03 — ABNORMAL HIGH (ref 1.005–1.030)
UROBILINOGEN UA: 0.2 mg/dL (ref 0.0–1.0)

## 2014-09-22 LAB — CBC
HCT: 31.8 % — ABNORMAL LOW (ref 36.0–46.0)
HEMOGLOBIN: 9.4 g/dL — AB (ref 12.0–15.0)
MCH: 20.5 pg — AB (ref 26.0–34.0)
MCHC: 29.6 g/dL — AB (ref 30.0–36.0)
MCV: 69.3 fL — ABNORMAL LOW (ref 78.0–100.0)
PLATELETS: 379 10*3/uL (ref 150–400)
RBC: 4.59 MIL/uL (ref 3.87–5.11)
RDW: 19.8 % — ABNORMAL HIGH (ref 11.5–15.5)
WBC: 8.5 10*3/uL (ref 4.0–10.5)

## 2014-09-22 LAB — WET PREP, GENITAL
Clue Cells Wet Prep HPF POC: NONE SEEN
Trich, Wet Prep: NONE SEEN
Yeast Wet Prep HPF POC: NONE SEEN

## 2014-09-22 MED ORDER — IBUPROFEN 600 MG PO TABS: 600.0000 mg | ORAL_TABLET | Freq: Four times a day (QID) | ORAL | Status: DC | PRN

## 2014-09-22 MED ORDER — MICONAZOLE NITRATE 2 % VA CREA: 1.0000 | TOPICAL_CREAM | Freq: Every day | VAGINAL | Status: DC

## 2014-09-22 MED ORDER — IBUPROFEN 600 MG PO TABS
600.0000 mg | ORAL_TABLET | Freq: Once | ORAL | Status: AC
  Administered 2014-09-22: 600 mg via ORAL
  Filled 2014-09-22: qty 1

## 2014-09-22 NOTE — Discharge Instructions (Signed)
Dolor abdominal en las mujeres  (Abdominal Pain, Women)  El dolor abdominal (en el estómago, la pelvis o el vientre) puede tener muchas causas. Es importante que le informe a su médico:  · La ubicación del dolor.  · ¿Viene y se va, o persiste todo el tiempo?  · ¿Hay situaciones que inician el dolor (comer ciertos alimentos, la actividad física)?  · ¿Tiene otros síntomas asociados al dolor (fiebre, náuseas, vómitos, diarrea)?  Todo es de gran ayuda cuando se trata de hallar la causa del dolor.  CAUSAS  · Estómago: Infecciones por virus o bacterias, o úlcera.  · Intestino: Apendicitis (apéndice inflamado), ileitis regional (enfermedad de Crohn), colitis ulcerosa (colon inflamado), síndrome del colon irritable, diverticulitis (inflamación de los divertículos del colon) o cáncer de estómago oo intestino.  · Enfermedades de la vesícula biliar o cálculos.  · Enfermedades renales, cálculos o infecciones en el riñón.  · Infección o cáncer del páncreas.  · Fibromialgia (trastorno doloroso)  · Enfermedades de los órganos femeninos:  · Uterus: Útero: fibroma (tumor no canceroso) o infección  · Trompas de [place]: infección o embarazo ectópico  · En los ovarios, quistes o tumores.  · Adherencias pélvicas (tejido cicatrizal).  · Endometriosis (el tejido que cubre el útero se desarrolla en la pelvis y los órganos pélvicos).  · Síndrome de congestión pélvica (los órganos femeninos se llenan de sangre antes del periodo menstrual(  · Dolor durante el periodo menstrual.  · Dolor durante la ovulación (al producir óvulos).  · Dolor al usar el DIU (dispositivo intrauterino para el control de la natalidad)  · Cáncer en los órganos femeninos.  · Dolor funcional (no está originado en una enfermedad, puede mejorar sin tratamiento).  · Dolor de origen psicológico  · Depresión.  DIAGNÓSTICO  Su médico decidirá la gravedad del dolor a través del examen físico  · Análisis de sangre  · Radiografías  · Ecografías  · TC (tomografía computada, tipo  especial de radiografías).  · IMR (resonancia magnética)  · Cultivos, en el caso una infección  · Colon por enema de bario (se inserta una sustancia de contraste en el intestino grueso para mejorar la observación con rayos X.)  · Colonoscopía (observación del intestino con un tubo luminoso).  · Laparoscopía (examen del interior del abdomen con un tubo que tiene una luz).  · Cirugía exploratoria abdominal mayor (se observa el abdomen realizando una gran incisión).  TRATAMIENTO  El tratamiento dependerá de la causa del problema.   · Muchos de estos casos pueden controlarse y tratarse en casa.  · Medicamentos de venta libre indicados por el médico.  · Medicamentos con receta.  · Antibióticos, en caso de infección  · Píldoras anticonceptivas, en el caso de períodos dolorosos o dolor al ovular.  · Tratamiento hormonal, para la endometriosis  · Inyecciones para bloqueo nervioso selectivo.  · Fisioterapia.  · Antidepresivos.  · Consejos por parte de un psícólogo o psiquiatra.  · Cirugía mayor o menor.  INSTRUCCIONES PARA EL CUIDADO DOMICILIARIO  · No tome ni administre laxantes a menos que se lo haya indicado su médico.  · Tome analgésicos de venta libre sólo si se lo ha indicado el profesional que lo asiste. No tome aspirina, ya que puede causar molestias en el estómago o hemorragias.  · Consuma una dieta líquida (caldo o agua) según lo indicado por el médico. Progrese lentamente a una dieta blanda, según la tolerancia, si el dolor se relaciona con el estómago o el intestino.  ·   no se [employer] o la [place], [place] tratar con:  Acupuntura.  Ejercicios de relajacin (yoga,  meditacin).  Terapia grupal.  Psicoterapia. SOLICITE ATENCIN MDICA SI:  Nota que ciertos Writer de [place].  El tratamiento indicado para Lexicographer no Engineer, civil (consulting).  Necesita analgsicos ms fuertes.  Quiere que le retiren el DIU.  Si se siente confundido o desfalleciente.  Presenta nuseas o vmitos.  Aparece una erupcin cutnea.  Sufre efectos adversos o una reaccin alrgica debido a los medicamentos que toma. SOLICITE ATENCIN MDICA DE INMEDIATO SI:  El dolor persiste o se agrava.  Tiene fiebre.  Siente el dolor slo en algunos sectores del abdomen. Si se localiza en la zona derecha, posiblemente podra tratarse de apendicitis. En un adulto, si se localiza en la regin inferior izquierda del abdomen, podra tratarse de colitis o diverticulitis.  Hay sangre en las heces (deposiciones de color rojo brillante o negro alquitranado), con o sin vmitos.  Usted presenta sangre en la orina.  Siente escalofros con o sin fiebre.  Se desmaya. ASEGRESE QUE:   Comprende estas instrucciones.  Controlar su enfermedad.  Solicitar ayuda de inmediato si no mejora o si empeora. Document Released: 10/05/2008 Document Revised: 09/11/2011 [facility] Patient Information 2015 [employer]. This information is not intended to replace advice given to you by your health care provider. Make sure you discuss any questions you have with your health care provider.  Vaginitis monilisica (Monilial Vaginitis) La vaginitis es una inflamacin (irritacin, hinchazn) de la vagina y la vulva. Esta no es una enfermedad de transmisin sexual.  CAUSAS Este tipo de vaginitis lo causa un hongo (candida) que normalmente se encuentra en la vagina. El hongo candida se ha desarrollado hasta el punto de ocasionar problemas en el equilibrio qumico. SNTOMAS  Secrecin vaginal espesa y blanca.  Hinchazn, picazn, enrojecimiento e inflamacin de la vagina  y en algunos casos de los labios vaginales (vulva).  Ardor o dolor al [employer].  Dolor en [place]. DIAGNSTICO Los factores que favorecen la vaginitis moniliasica son:  Kyla Balzarine de virginidad y postmenopusicas.  Embarazo.  Infecciones.  Sentir cansancio, estar enferma o estresada, especialmente si ya ha sufrido este problema en el pasado.  Diabetes Buen control ayudar a disminur la probabilidad.  Pldoras anticonceptivas  Ropa interior [place].  El uso de espumas de bao, aerosoles femeninos duchas vaginales o tampones con desodorante.  Algunos antibiticos (medicamentos que destruyen grmenes).  Si contrae alguna enfermedad puede sufrir recurrencias espordicas. [place] profesional que lo asiste prescribir medicamentos.  Hay diferentes tipos de cremas y supositorios vaginales que tratan especficamente la vaginitis monilisica. Para infecciones por hongos recurrentes, utilice un supositorio o crema en la vagina dos veces por semana, o segn se le indique.  Tambin podrn utilizarse cremas con corticoides o anti monilisicas para la picazn o la irritacin de la vulva. Consulte con el profesional que la asiste.  Si la crema no da resultado, podr aplicarse en la vagina una solucin con azul de metileno.  El consumo de yogur puede prevenir este tipo de vaginitis. INSTRUCCIONES [place] todos los medicamentos tal como se le indic.  No mantenga relaciones sexuales hasta que el tratamiento se haya completado, o segn las indicaciones del profesional que la asiste.  Tome baos de asiento tibios.  No se aplique duchas vaginales.  No utilice tampones, especialmente los perfumados.  Use ropa interior de [employer]  y las medias tipo panty.  Comunique a sus compaeros sexuales que sufre una infeccin por hongos. Ellos deben concurrir para un control mdico si tienen sntomas como una  urticaria leve o picazn.  Sus compaeros sexuales deben tratarse tambin si la infeccin es difcil de Radiographer, therapeutic.  Practique el sexo seguro - use condones  Algunos medicamentos vaginales ocasionan fallas en los condones de ltex. Los medicamentos vaginales que pueden daar los condones son:  Building services engineer cleocina  Butoconazole (Femstat)  Terconazole (Terazol) supositorios vaginales  Miconazole (Monistat) (es un medicamento de venta libre) SOLICITE ATENCIN MDICA SI:  Waldron Session tiene una temperatura oral de ms de 38,9 C (102 F).  Si la infeccin empeora luego de 2 das de tratamiento.  Si la infeccin no mejora luego de 3 das de tratamiento.  Aparecen ampollas en o alrededor de la vagina.  Si aparece una hemorragia vaginal y no es el momento del perodo.  Siente dolor al [employer].  Presenta problemas intestinales.  Tiene dolor durante las [employer]. Document Released: 03/29/2005 Document Revised: 09/11/2011 [facility] Patient Information 2015 [place]. This information is not intended to replace advice given to you by your health care provider. Make sure you discuss any questions you have with your health care provider.

## 2014-09-22 NOTE — MAU Provider Note (Signed)
Chief Complaint: Abdominal Pain and Vaginal Discharge   First Provider Initiated Contact with Patient 09/22/14 1058     SUBJECTIVE HPI: Angela Ruiz is a 44 y.o. [id number] female who presents with: 1. Low abd pain x 2 weeks-thinks may be ovarian pain.  2. Vaginal discharge w/ burning and irritation x 2+ weeks 3. Breast tenderness x 2 weeks 4. Frequency of urination for several weeks 5. Mass on right buttock for years.   Describes pain as constant, sharp, moderate, no relationship to eating, urination, BM. Hasn't tried anything to Tx the pain.   Doesn't have Gyn or PCP.   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   Past Medical History  Diagnosis Date  . Hypertension    OB History  Gravida Para Term Preterm AB SAB TAB Ectopic Multiple Living  8 6 6  2 2    6     # Outcome Date GA Lbr Len/2nd Weight Sex Delivery Anes PTL Lv  8 SAB           7 SAB           6 Term           5 Term           4 Term           3 Term           2 Term           1 Term              Past Surgical History  Procedure Laterality Date  . Cesarean section      2007   History   Social History  . Marital Status: Married    Spouse Name: N/A  . Number of Children: N/A  . Years of Education: N/A   Occupational History  . Not on file.   Social History Main Topics  . Smoking status: Never Smoker   . Smokeless tobacco: Not on file  . Alcohol Use: No  . Drug Use: No  . Sexual Activity: Not on file   Other Topics Concern  . Not on file   Social History Narrative   No current facility-administered medications on file prior to encounter.   Current Outpatient Prescriptions on File Prior to Encounter  Medication Sig Dispense Refill  . ferrous sulfate 325 (65 FE) MG tablet Take 1 tablet (325 mg total) by mouth 3 (three) times daily with meals. (Patient not taking: Reported on 09/22/2014) 90 tablet 3  . Vitamin D, Ergocalciferol, (DRISDOL) 50000 UNITS CAPS capsule Take 1 capsule (50,000 Units total)  by mouth every 7 (seven) days. (Patient not taking: Reported on 09/22/2014) 30 capsule 0   Allergies  Allergen Reactions  . Eggs Or Egg-Derived Products Nausea And Vomiting   Review of Systems  Constitutional: Negative for fever and chills.  Gastrointestinal: Positive for abdominal pain. Negative for nausea, vomiting, diarrhea, constipation and blood in stool.  Genitourinary: Positive for frequency. Negative for dysuria, urgency, hematuria and flank pain.       Positive for vaginal discharge, vaginal irritation and burning. Neg for vaginal bleeding or dyspareunia.  Skin:       Positive for skin mass    OBJECTIVE Blood pressure 119/77, pulse 82, temperature 98.5 F (36.9 C), temperature source Oral, resp. rate 16, height 4\' 11"  (1.499 m), weight 73.029 kg (161 lb), last menstrual period 09/09/2014. GENERAL: Well-developed, well-nourished female in no acute distress.  HEENT: Normocephalic HEART: normal rate RESP: normal effort  SKIN: 3 cm raised, pedunculated, skin-colored, textured mass on right buttock. ABDOMEN: Soft, Mild SP tenderness. Neg rebound tenderness, mass. Pos BS x 4. Neg CVAT.  EXTREMITIES: Nontender, no edema NEURO: Alert and oriented SPECULUM EXAM: NEFG except for erythema of vulva, moderate amount of mixed curdlike and creamy, white, odorless discharge, no blood noted, cervix clean BIMANUAL: cervix closed; uterus normal size, ? right adnexal mass. No tenderness. Left adnexa normal. No CMT.  LAB RESULTS Results for orders placed or performed during the hospital encounter of 09/22/14 (from the past 24 hour(s))  Urinalysis, Routine w reflex microscopic     Status: Abnormal   Collection Time: 09/22/14  9:34 AM  Result Value Ref Range   Color, Urine YELLOW YELLOW   APPearance CLEAR CLEAR   Specific Gravity, Urine >1.030 (H) 1.005 - 1.030   pH 5.5 5.0 - 8.0   Glucose, UA NEGATIVE NEGATIVE mg/dL   Hgb urine dipstick MODERATE (A) NEGATIVE   Bilirubin Urine NEGATIVE  NEGATIVE   Ketones, ur NEGATIVE NEGATIVE mg/dL   Protein, ur NEGATIVE NEGATIVE mg/dL   Urobilinogen, UA 0.2 0.0 - 1.0 mg/dL   Nitrite NEGATIVE NEGATIVE   Leukocytes, UA NEGATIVE NEGATIVE  Urine microscopic-add on     Status: Abnormal   Collection Time: 09/22/14  9:34 AM  Result Value Ref Range   Squamous Epithelial / LPF FEW (A) RARE   WBC, UA 0-2 <3 WBC/hpf   RBC / HPF 0-2 <3 RBC/hpf   Bacteria, UA FEW (A) RARE   Urine-Other MUCOUS PRESENT   Pregnancy, urine POC     Status: None   Collection Time: 09/22/14 10:39 AM  Result Value Ref Range   Preg Test, Ur NEGATIVE NEGATIVE  CBC     Status: Abnormal   Collection Time: 09/22/14 11:05 AM  Result Value Ref Range   WBC 8.5 4.0 - 10.5 K/uL   RBC 4.59 3.87 - 5.11 MIL/uL   Hemoglobin 9.4 (L) 12.0 - 15.0 g/dL   HCT 31.8 (L) 36.0 - 46.0 %   MCV 69.3 (L) 78.0 - 100.0 fL   MCH 20.5 (L) 26.0 - 34.0 pg   MCHC 29.6 (L) 30.0 - 36.0 g/dL   RDW 19.8 (H) 11.5 - 15.5 %   Platelets 379 150 - 400 K/uL  Wet prep, genital     Status: Abnormal   Collection Time: 09/22/14 11:20 AM  Result Value Ref Range   Yeast Wet Prep HPF POC NONE SEEN NONE SEEN   Trich, Wet Prep NONE SEEN NONE SEEN   Clue Cells Wet Prep HPF POC NONE SEEN NONE SEEN   WBC, Wet Prep HPF POC FEW (A) NONE SEEN  GC/Chlamydia Probe Amp *Canceled*     Status: None ()   Collection Time: 09/22/14 11:22 AM   Narrative   LIS Cancel (ORR/DE = Data Error)    IMAGING No results found.  MAU COURSE Can't wait for [place]. Wants to schedule OP.  No emergent condition apparent. Stable for OP imaging.   Ibuprofen given. Feeling better.   ASSESSMENT 1. Vulvovaginal candidiasis   2. Abdominal pain in female   3. Skin mass   4. Frequency of urination   5. Adnexal mass    PLAN Discharge home in stable condition. Urine culture, GC/Chlamydia pending. F/U w/ PCP for eval of skin mass     Follow-up Information    Follow up with [facility]   Specialty:  Obstetrics and  Gynecology   Why:  Will call you to  schedule a follow-up appointment to review your ultrasound results   Contact information:   [place] [phone]      Follow up with [facility].   Why:  For routine primary care and evaluation of mole   Contact information:   [place]      Follow up with [facility].   Why:  As needed in emergencies   Contact information:   [place] [phone]        Medication List    STOP taking these medications        Vitamin D (Ergocalciferol) 50000 UNITS Caps capsule  Commonly known as:  DRISDOL      TAKE these medications        ferrous sulfate 325 (65 FE) MG tablet  Take 1 tablet (325 mg total) by mouth 3 (three) times daily with meals.     ibuprofen 600 MG tablet  Commonly known as:  ADVIL,MOTRIN  Take 1 tablet (600 mg total) by mouth every 6 (six) hours as needed.     miconazole 2 % vaginal cream  Commonly known as:  MONISTAT 7  Place 1 Applicatorful vaginally at bedtime.       [place], [place] 09/22/2014  12:42 PM

## 2014-09-22 NOTE — MAU Note (Addendum)
Ovarian pain (for 2 months, past 2 wks has been constant), vag d/c (last month).  Breasts are also hurting.(2 wks), swollen ? Tissue growth on bottom, painful- 2wks ago.

## 2014-09-22 NOTE — MAU Note (Signed)
C/o cramping for past 2 weeks; denies bleeding;

## 2014-09-22 NOTE — MAU Note (Signed)
Urine in lab 

## 2014-09-22 NOTE — MAU Note (Signed)
Also c/o white vaginal discharge;

## 2014-09-23 LAB — CERVICOVAGINAL ANCILLARY ONLY
Chlamydia: NEGATIVE
Neisseria Gonorrhea: NEGATIVE

## 2014-09-23 LAB — HIV ANTIBODY (ROUTINE TESTING W REFLEX): HIV SCREEN 4TH GENERATION: NONREACTIVE

## 2014-09-28 ENCOUNTER — Ambulatory Visit (HOSPITAL_COMMUNITY): Admission: RE | Admit: 2014-09-28 | Payer: Self-pay | Source: Ambulatory Visit

## 2014-10-13 ENCOUNTER — Ambulatory Visit (HOSPITAL_COMMUNITY)
Admission: RE | Admit: 2014-10-13 | Discharge: 2014-10-13 | Disposition: A | Discharge status: Home / Self Care | Payer: Self-pay | Source: Ambulatory Visit | Attending: Advanced Practice Midwife | Admitting: Advanced Practice Midwife

## 2014-10-13 DIAGNOSIS — B3731 Acute candidiasis of vulva and vagina: Secondary | ICD-10-CM

## 2014-10-13 DIAGNOSIS — B373 Candidiasis of vulva and vagina: Secondary | ICD-10-CM

## 2014-10-13 DIAGNOSIS — R350 Frequency of micturition: Secondary | ICD-10-CM

## 2014-10-13 DIAGNOSIS — R229 Localized swelling, mass and lump, unspecified: Secondary | ICD-10-CM

## 2014-10-13 DIAGNOSIS — R109 Unspecified abdominal pain: Secondary | ICD-10-CM

## 2014-10-13 DIAGNOSIS — D250 Submucous leiomyoma of uterus: Secondary | ICD-10-CM | POA: Insufficient documentation

## 2014-10-13 DIAGNOSIS — R1030 Lower abdominal pain, unspecified: Secondary | ICD-10-CM | POA: Insufficient documentation

## 2014-10-13 DIAGNOSIS — N9489 Other specified conditions associated with female genital organs and menstrual cycle: Secondary | ICD-10-CM

## 2014-10-22 ENCOUNTER — Encounter: Payer: Self-pay | Admitting: Obstetrics & Gynecology

## 2014-10-25 ENCOUNTER — Encounter: Payer: Self-pay | Admitting: Advanced Practice Midwife

## 2014-10-25 DIAGNOSIS — D259 Leiomyoma of uterus, unspecified: Secondary | ICD-10-CM | POA: Insufficient documentation

## 2014-10-26 ENCOUNTER — Telehealth: Payer: Self-pay | Admitting: *Deleted

## 2014-10-26 NOTE — Telephone Encounter (Signed)
Contacted patient with spanish interpreter Doretha Imus, results of ultrasound given, pt states she is having pain and heavy bleeding.  Pt to call the front office to schedule an appointment.

## 2014-10-26 NOTE — Telephone Encounter (Signed)
-----   Message from [place], [place] sent at 10/25/2014  4:08 AM EDT ----- Outpatient US shows fibroids. No F/U appt needed unless symptomatic or other Gyn needs.

## 2014-11-11 ENCOUNTER — Encounter: Payer: Self-pay | Admitting: Obstetrics & Gynecology

## 2014-11-11 ENCOUNTER — Ambulatory Visit (INDEPENDENT_AMBULATORY_CARE_PROVIDER_SITE_OTHER): Payer: Self-pay | Admitting: Obstetrics & Gynecology

## 2014-11-11 VITALS — BP 116/62 | HR 89 | Temp 98.7°F | Resp 20 | Wt 160.4 lb

## 2014-11-11 DIAGNOSIS — N920 Excessive and frequent menstruation with regular cycle: Secondary | ICD-10-CM

## 2014-11-11 DIAGNOSIS — D259 Leiomyoma of uterus, unspecified: Secondary | ICD-10-CM

## 2014-11-11 DIAGNOSIS — N924 Excessive bleeding in the premenopausal period: Secondary | ICD-10-CM

## 2014-11-11 MED ORDER — MEDROXYPROGESTERONE ACETATE 10 MG PO TABS: 20.0000 mg | ORAL_TABLET | Freq: Every day | ORAL | Status: DC

## 2014-11-11 NOTE — Progress Notes (Signed)
Patient ID: Angela Ruiz, female   DOB: 1971-01-07, 44 y.o.   MRN: [id number]  Chief Complaint  Patient presents with  . Follow-up    HPI Angela Ruiz is a 44 y.o. female.  [id number] Patient's last menstrual period was 10/22/2014 (exact date). Heavy painful menses with flow for up to 10 days, US showed fibroids.   HPI  Past Medical History  Diagnosis Date  . Hypertension     Past Surgical History  Procedure Laterality Date  . Cesarean section      2007    Family History  Problem Relation Age of Onset  . Alcohol abuse Neg Hx   . Arthritis Neg Hx   . Asthma Neg Hx   . Birth defects Neg Hx   . Cancer Neg Hx   . COPD Neg Hx   . Depression Neg Hx   . Diabetes Neg Hx   . Drug abuse Neg Hx   . Early death Neg Hx   . Hearing loss Neg Hx   . Heart disease Neg Hx   . Hyperlipidemia Neg Hx   . Hypertension Neg Hx   . Kidney disease Neg Hx   . Learning disabilities Neg Hx   . Mental illness Neg Hx   . Mental retardation Neg Hx   . Miscarriages / Stillbirths Neg Hx   . Stroke Neg Hx   . Vision loss Neg Hx   . Varicose Veins Neg Hx     Social History History  Substance Use Topics  . Smoking status: Former Smoker    Quit date: 07/14/2003  . Smokeless tobacco: Not on file  . Alcohol Use: No    Allergies  Allergen Reactions  . Eggs Or Egg-Derived Products Nausea And Vomiting    Current Outpatient Prescriptions  Medication Sig Dispense Refill  . ibuprofen (ADVIL,MOTRIN) 600 MG tablet Take 1 tablet (600 mg total) by mouth every 6 (six) hours as needed. 30 tablet 1   No current facility-administered medications for this visit.    Review of Systems Review of Systems  Constitutional: Negative.   Respiratory: Negative.   Gastrointestinal: Negative.   Genitourinary: Positive for dysuria, frequency and menstrual problem. Negative for vaginal bleeding and vaginal discharge.    Blood pressure 116/62, pulse 89, temperature 98.7 F (37.1 C),  temperature source Oral, resp. rate 20, weight 160 lb 6.4 oz (72.757 kg), last menstrual period 10/22/2014.  Physical Exam Physical Exam  Constitutional: She is oriented to person, place, and time. She appears well-developed. No distress.  Cardiovascular: Normal rate.   Pulmonary/Chest: Effort normal. No respiratory distress.  Abdominal: Soft. She exhibits mass (to below umbilicus. ).  Vertical incision scar  Genitourinary: Vagina normal. No vaginal discharge found.  14-16 weeks uterus, pap done   Neurological: She is alert and oriented to person, place, and time.  Skin: Skin is warm and dry. No pallor.  Psychiatric: She has a normal mood and affect. Her behavior is normal.  Vitals reviewed.   Data Reviewed CBC    Component Value Date/Time   WBC 8.5 09/22/2014 1105   RBC 4.59 09/22/2014 1105   RBC 4.69 06/13/2013 0946   HGB 9.4* 09/22/2014 1105   HCT 31.8* 09/22/2014 1105   PLT 379 09/22/2014 1105   MCV 69.3* 09/22/2014 1105   MCH 20.5* 09/22/2014 1105   MCHC 29.6* 09/22/2014 1105   RDW 19.8* 09/22/2014 1105   LYMPHSABS 2.8 07/14/2013 0939   MONOABS 0.4 07/14/2013 0939   EOSABS 0.0 07/14/2013 7106  BASOSABS 0.0 07/14/2013 0939    CLINICAL DATA: Lower abdominal pain, adnexal mass  EXAM: TRANSABDOMINAL AND TRANSVAGINAL ULTRASOUND OF PELVIS  TECHNIQUE: Both transabdominal and transvaginal ultrasound examinations of the pelvis were performed. Transabdominal technique was performed for global imaging of the pelvis including uterus, ovaries, adnexal regions, and pelvic cul-de-sac. It was necessary to proceed with endovaginal exam following the transabdominal exam to visualize the endometrium.  COMPARISON: 06/30/2013  FINDINGS: Uterus  Measurements: 12.7 x 7.3 x 8.5 cm. Dominant 6.7 x 6.4 x 6.3 cm submucosal/intracavitary fibroid. Additional fibroids, including a 2.3 x 2.1 x 2.2 cm right fundal fibroid and a 2.4 x 2.2 x 2.1 cm left lower uterine body  fibroid.  Endometrium  Thickness: 11 mm. Distorted/displaced by dominant intracavitary fibroid.  Right ovary  Measurements: 2.7 x 1.4 x 1.7 cm. Best visualized transabdominally. Normal appearance/no adnexal mass.  Left ovary  Measurements: 2.4 x 1.5 x 1.7 cm. Best visualized transabdominally. Normal appearance/no adnexal mass.  Other findings  No free fluid.  IMPRESSION: Multiple uterine fibroids, including a dominant 6.7 cm submucosal/intracavitary fibroid.  Bilateral ovaries are within normal limits.   Electronically Signed  By: Julian Hy M.D.  On: 10/13/2014 12:41  Assessment    Sx fibroid uterus with menorrhagia, mild anemia     Plan    Candidate for TAH. Pap sent, info on hysterectomy given. Provera 20 mg daily, FeSO4 supplement, RTC 4 weeks if financial assistance in effect        Fernando Torry 11/11/2014, 4:53 PM

## 2014-11-11 NOTE — Progress Notes (Signed)
Interpreter [name] present for encounter.  Follow up from MAU visit for [place] results and pelvic pain.

## 2014-11-11 NOTE — Patient Instructions (Signed)
Histerectoma abdominal (Abdominal Hysterectomy) La histerectoma abdominal es un procedimiento quirrgico en el que se extirpa el tero. El tero es el rgano muscular donde se desarrolla el feto. Esta ciruga puede hacerse por muchos motivos. Puede necesitar una histerectoma abdominal si tiene cncer, tumores, dolor a largo plazo o hemorragia. Tambin pueden hacerle este procedimiento si el tero ha descendido hacia la vagina (prolapso uterino). Segn las causas por las que necesite una histerectoma abdominal, es posible que tambin le extirpen otros rganos del aparato reproductor. Estos podran incluir la parte de la vagina que se conecta con el tero (cuello del tero), los rganos que producen vulos (ovarios) y las trompas que [employer] ovarios con el tero (trompas de [place]). INFORME AL MDICO:   Cualquier alergia que tenga.  Todos los [employer], incluidos vitaminas, hierbas, gotas oftlmicas, cremas y medicamentos de venta libre.  Problemas previos que usted o los [employer] de su familia hayan tenido con el uso de anestsicos.  Enfermedades de la sangre que tenga.  Cirugas previas.  Enfermedades que tenga. RIESGOS Y COMPLICACIONES En general, se trata de un procedimiento seguro. Sin embargo, Engineer, technical sales, pueden surgir problemas. La infeccin es el problema ms frecuente despus de una histerectoma abdominal. Otros problemas posibles incluyen:  Hemorragias.  Formacin de cogulos sanguneos que pueden desprenderse y Sports administrator a los pulmones.  Lesin en otros rganos cercanos al tero.  Lesin en los nervios que causa neuralgia.  Menor inters sexual o [place]. ANTES DEL [place] abdominal es un procedimiento quirrgico mayor. Puede afectar la percepcin que tiene de usted [place]. Hable con el mdico [employer] fsicos y emocionales que puede causar la histerectoma.  Quiz deban  hacerle anlisis de [place] y radiografas antes de la [place].  Si fuma, deje de hacerlo. Pdale ayuda al mdico si est teniendo inconvenientes para dejar de fumar.  Deje de tomar medicamentos anticoagulantes segn las indicaciones del mdico.  Pueden indicarle que tome antibiticos o laxantes antes de la [place].  No coma ni beba nada durante las 6 a 8 horas previas a la [place].  Tome sus medicamentos habituales con un sorbito de [place].  Tome un bao de inmersin o una ducha la noche o la maana anterior al procedimiento. PROCEDIMIENTO  La histerectoma abdominal se hace en el quirfano del hospital.  En la mayora de los Springer, se le administrar un medicamento que la har dormir (anestesia general).  El cirujano har un corte (incisin) a travs de la piel en la parte inferior del abdomen.  La incisin puede tener de 5a 7pulgadas de [place]. Puede ser horizontal o vertical.  El cirujano apartar el tejido que recubre al tero. Luego, extraer con cuidado el tero junto con cualquier otro rgano del aparato reproductor que Media planner.  La hemorragia se controlar con pinzas o suturas.  El cirujano cerrar la incisin con suturas o clips metlicos. DESPUS DEL PROCEDIMIENTO  Sentir algo de dolor inmediatamente despus del procedimiento.  Le administrarn medicamentos para calmar el dolor cuando est en el rea de recuperacin.  La llevarn a la habitacin del hospital cuando se haya recuperado de la anestesia.  Es posible que [facility] hospital durante 2a 5das.  Recibir instrucciones para recuperarse en su casa. Document Released: 06/24/2013 [facility] Patient Information 2015 [place]. This information is not intended to replace advice given to you by your health care provider. Make sure you discuss any questions you have with your health  care provider. Fibroma uterino (Uterine Fibroid) Un fibroma uterino es un crecimiento (tumor) dentro del  tero. Este tipo de tumor no es Radio broadcast assistant y no se extiende fuera del tero. Podr tener uno o varios fibromas. Los fibromas pueden variar en tamao, peso y TEFL teacher en que se desarrollan dentro del tero. Algunos pueden llegar a ser bastante grandes. La mayora de los fibromas no necesitan tratamiento mdico, pero algunos pueden causar dolor o sangrado abundante durante los perodos y [place]. CAUSAS  Un fibroma es el resultado del desarrollo continuo de una nica clula uterina que sigue creciendo (no regulada) que es diferente al resto de las clulas del cuerpo humano. La mayora de las clulas tiene un mecanismo de control que evita que se reproduzcan de Research officer, trade union.  SIGNOS Y SNTOMAS   Hemorragias.  Dolor y sensacin de presin en la pelvis.  Problemas en la vejiga debido al tamao del fibroma.  Infertilidad y abortos espontneos, segn el tamao y la ubicacin del fibroma. DIAGNSTICO  Los fibromas uterinos se diagnostican con un examen fsico. El mdico puede palpar los tumores abultados al realizar el examen de la pelvis. Una ecografa puede indicarse para tener informacin del tamao, la ubicacin y el nmero de tumores.  TRATAMIENTO   El mdico puede considerar que es conveniente esperar y Barrister's clerk. Esto incluye el control del fibroma por parte del mdico para observar si crece o disminuye su tamao.  Podr indicarle un tratamiento hormonal o el uso de un dispositivo intrauterino (DIU).  En algunos casos es necesaria la ciruga para extirpar el fibroma (miomectoma) o el tero (histerectoma). Esto depender de su situacin. Cuando una mujer desea quedar embarazada y los fibromas interfieren en su fertilidad, el mdico puede recomendar la extirpacin del fibroma.  INSTRUCCIONES PARA EL CUIDADO EN EL HOGAR  Los cuidados en el hogar dependen del tratamiento que haya recibido. En general:   Cumpla con todas las visitas de control, segn le indique su  mdico.  Tome slo medicamentos de venta libre o recetados, segn las indicaciones del mdico. Si le recetaron un tratamiento hormonal, tome los medicamentos hormonales como le indicaron. No tome aspirina. Puede ocasionar hemorragias.  Consulte al mdico si debe tomar pldoras de hierro.  Si sus perodos son molestos pero no tan abundantes, acustese con los pies ligeramente elevados por encima del nivel del corazn. Coloque compresas fras en la zona inferior del abdomen.  Si sus perodos son muy abundantes, anote el nmero de compresas o tampones que [place] cada mes. Lleve esta informacin a su consulta mdica.  Incluya vegetales verdes en su dieta. SOLICITE ATENCIN MDICA DE INMEDIATO SI:  Siente dolor o clicos en la pelvis y no puede controlarlos con los medicamentos.  El dolor en la pelvis aumenta de manera repentina.  Aumenta el sangrado entre los perodos o [employer].  Si tiene perodos muy abundantes y debe cambiar un tampn o una toalla higinica cada media hora o menos.  Se siente mareado o tiene episodios de [place]. Document Released: 06/19/2005 Document Revised: 04/09/2013 [facility] Patient Information 2015 [place], [place]. This information is not intended to replace advice given to you by your health care provider. Make sure you discuss any questions you have with your health care provider.

## 2014-11-13 LAB — CYTOLOGY - PAP

## 2014-11-24 ENCOUNTER — Other Ambulatory Visit: Payer: Self-pay | Admitting: Advanced Practice Midwife

## 2014-11-24 DIAGNOSIS — D259 Leiomyoma of uterus, unspecified: Secondary | ICD-10-CM

## 2014-11-24 DIAGNOSIS — N939 Abnormal uterine and vaginal bleeding, unspecified: Secondary | ICD-10-CM

## 2014-11-24 MED ORDER — MEGESTROL ACETATE 20 MG PO TABS: 40.0000 mg | ORAL_TABLET | Freq: Two times a day (BID) | ORAL | Status: DC

## 2014-11-24 NOTE — Progress Notes (Signed)
Taking Provera for fibroid bleeding. Causing cramping. Will try switching to megace. If no improvement in cramping after three days change in therapy pt will need to be seen by provider.

## 2014-12-02 ENCOUNTER — Telehealth: Payer: Self-pay | Admitting: *Deleted

## 2014-12-02 NOTE — Telephone Encounter (Signed)
Patient called and left message in [place] stating that her medicine is causing her to have pain in her ovaries and legs. She would like a different rx.

## 2014-12-03 ENCOUNTER — Inpatient Hospital Stay (HOSPITAL_COMMUNITY): Payer: Self-pay

## 2014-12-03 ENCOUNTER — Encounter (HOSPITAL_COMMUNITY): Payer: Self-pay | Admitting: *Deleted

## 2014-12-03 ENCOUNTER — Inpatient Hospital Stay (HOSPITAL_COMMUNITY)
Admission: AD | Admit: 2014-12-03 | Discharge: 2014-12-03 | Disposition: A | Discharge status: Home / Self Care | Payer: Self-pay | Source: Ambulatory Visit | Attending: Obstetrics & Gynecology | Admitting: Obstetrics & Gynecology

## 2014-12-03 DIAGNOSIS — R102 Pelvic and perineal pain: Secondary | ICD-10-CM | POA: Insufficient documentation

## 2014-12-03 DIAGNOSIS — Z87891 Personal history of nicotine dependence: Secondary | ICD-10-CM | POA: Insufficient documentation

## 2014-12-03 DIAGNOSIS — R1031 Right lower quadrant pain: Secondary | ICD-10-CM | POA: Insufficient documentation

## 2014-12-03 DIAGNOSIS — R109 Unspecified abdominal pain: Secondary | ICD-10-CM

## 2014-12-03 DIAGNOSIS — D259 Leiomyoma of uterus, unspecified: Secondary | ICD-10-CM | POA: Insufficient documentation

## 2014-12-03 LAB — CBC
HEMATOCRIT: 33.9 % — AB (ref 36.0–46.0)
Hemoglobin: 10.7 g/dL — ABNORMAL LOW (ref 12.0–15.0)
MCH: 23.7 pg — ABNORMAL LOW (ref 26.0–34.0)
MCHC: 31.6 g/dL (ref 30.0–36.0)
MCV: 75 fL — AB (ref 78.0–100.0)
PLATELETS: 342 10*3/uL (ref 150–400)
RBC: 4.52 MIL/uL (ref 3.87–5.11)
RDW: 25.6 % — ABNORMAL HIGH (ref 11.5–15.5)
WBC: 14.7 10*3/uL — AB (ref 4.0–10.5)

## 2014-12-03 LAB — URINALYSIS, ROUTINE W REFLEX MICROSCOPIC
Bilirubin Urine: NEGATIVE
Glucose, UA: NEGATIVE mg/dL
KETONES UR: NEGATIVE mg/dL
Leukocytes, UA: NEGATIVE
Nitrite: NEGATIVE
PROTEIN: NEGATIVE mg/dL
SPECIFIC GRAVITY, URINE: 1.01 (ref 1.005–1.030)
Urobilinogen, UA: 0.2 mg/dL (ref 0.0–1.0)
pH: 5.5 (ref 5.0–8.0)

## 2014-12-03 LAB — URINE MICROSCOPIC-ADD ON

## 2014-12-03 LAB — POCT PREGNANCY, URINE: PREG TEST UR: NEGATIVE

## 2014-12-03 MED ORDER — ONDANSETRON 8 MG PO TBDP: 8.0000 mg | ORAL_TABLET | Freq: Three times a day (TID) | ORAL | Status: DC | PRN

## 2014-12-03 MED ORDER — KETOROLAC TROMETHAMINE 60 MG/2ML IM SOLN
60.0000 mg | Freq: Once | INTRAMUSCULAR | Status: AC
  Administered 2014-12-03: 60 mg via INTRAMUSCULAR
  Filled 2014-12-03: qty 2

## 2014-12-03 MED ORDER — OXYCODONE-ACETAMINOPHEN 5-325 MG PO TABS: 2.0000 | ORAL_TABLET | ORAL | Status: DC | PRN

## 2014-12-03 NOTE — MAU Provider Note (Signed)
History     CSN: [id number]  Arrival date and time: 12/03/14 1326   None     Chief Complaint  Patient presents with  . Pelvic Pain   HPI  Pt is 44 yo postmenopausal who presents with lower abdominal pain Pt has known fibroids and is scheduled for hysterectomy Pt has been doing well until yesterday when had acute right lower quadrant pain-no bleeding- pt took megace and according to chart called back and said it made her cramping/pain worse and was changed to provera Hurts to walk- pt has decreased appetite but no vomiting; no constipation or diarrhea or UTI sx. Pt has not taken anything for the pain- pt states it feels better with pressure Pt denies fever   Registered Nurse Addendum  MAU Note 12/03/2014 1:51 PM    Expand All Collapse All   since yesterday, pain in pelvic bone and in fold of rt leg, now radiating to back. Can't sit stand or lay down. No fever, no vomiting, no diarrhea, some nausea, no pain with urination., feels better when presses on the area      Past Medical History  Diagnosis Date  . Hypertension     Past Surgical History  Procedure Laterality Date  . Cesarean section      2007    Family History  Problem Relation Age of Onset  . Alcohol abuse Neg Hx   . Arthritis Neg Hx   . Asthma Neg Hx   . Birth defects Neg Hx   . Cancer Neg Hx   . COPD Neg Hx   . Depression Neg Hx   . Diabetes Neg Hx   . Drug abuse Neg Hx   . Early death Neg Hx   . Hearing loss Neg Hx   . Heart disease Neg Hx   . Hyperlipidemia Neg Hx   . Hypertension Neg Hx   . Kidney disease Neg Hx   . Learning disabilities Neg Hx   . Mental illness Neg Hx   . Mental retardation Neg Hx   . Miscarriages / Stillbirths Neg Hx   . Stroke Neg Hx   . Vision loss Neg Hx   . Varicose Veins Neg Hx     History  Substance Use Topics  . Smoking status: Former Smoker    Quit date: 07/14/2003  . Smokeless tobacco: Not on file  . Alcohol Use: No    Allergies:  Allergies  Allergen  Reactions  . Eggs Or Egg-Derived Products Nausea And Vomiting    Prescriptions prior to admission  Medication Sig Dispense Refill Last Dose  . ibuprofen (ADVIL,MOTRIN) 600 MG tablet Take 1 tablet (600 mg total) by mouth every 6 (six) hours as needed. 30 tablet 1 Taking  . medroxyPROGESTERone (PROVERA) 10 MG tablet Take 2 tablets (20 mg total) by mouth daily. 30 tablet 4   . megestrol (MEGACE) 20 MG tablet Take 2 tablets (40 mg total) by mouth 2 (two) times daily. Can increase to two tablets twice a day in the event of heavy bleeding 60 tablet 4     Review of Systems  Constitutional: Negative for fever and chills.  Gastrointestinal: Positive for nausea and abdominal pain. Negative for vomiting, diarrhea and constipation.  Genitourinary: Negative for dysuria and urgency.   Physical Exam   Blood pressure 108/84, pulse 98, temperature 99.8 F (37.7 C), temperature source Oral, resp. rate 20, height 4' 10.5" (1.486 m), weight 157 lb (71.215 kg), last menstrual period 11/21/2014.  Physical Exam  Nursing note and vitals reviewed. Constitutional: She is oriented to person, place, and time. She appears well-developed and well-nourished. No distress.  HENT:  Head: Normocephalic.  Eyes: Pupils are equal, round, and reactive to light.  Neck: Normal range of motion. Neck supple.  Cardiovascular: Normal rate.   Respiratory: Effort normal.  GI: Soft. She exhibits mass. She exhibits no distension. There is tenderness. There is guarding. There is no rebound.  Genitourinary:  bimanua- prominent right adnexal mass-size of golf ball- extremely painful to touch  Musculoskeletal: Normal range of motion.  Neurological: She is alert and oriented to person, place, and time.  Skin: Skin is warm and dry.  Psychiatric: She has a normal mood and affect.    MAU Course  Procedures CBC  Toradol 60mg  IM [place] US Pelvis Complete  12/03/2014   CLINICAL DATA:  Right lower quadrant pain x2 days  EXAM:  TRANSABDOMINAL ULTRASOUND OF PELVIS  TECHNIQUE: Transabdominal ultrasound examination of the pelvis was performed including evaluation of the uterus, ovaries, adnexal regions, and pelvic cul-de-sac.  COMPARISON:  10/13/2014  FINDINGS: Uterus  Measurements: 15.0 x 9.1 x 8.5 cm. Multiple uterine fibroids, including a dominant 10.6 x 8.5 x 10.0 cm subserosal right posterior fundal fibroid.  Endometrium  Thickness: 7 mm.  No focal abnormality visualized.  Right ovary  Measurements: 4.0 x 1.6 x 3.0 cm. Dominant 2.0 cm simple cyst/follicle. No color Doppler flow is visualized, although this may be technical. No ovarian enlargement/edema is noted on grayscale evaluation.  Left ovary  Measurements: 2.4 x 2.0 x 2.1 cm. Normal appearance/no adnexal mass. Normal color Doppler flow.  Other findings:  No free fluid.  Transvaginal pelvic evaluation was not performed. Notably, the right ovary was not visualized on prior transvaginal study due to high abdominal position.  IMPRESSION: Multiple uterine fibroids, including a dominant 10.6 cm subserosal right posterior fundal fibroid.  2.0 cm simple right ovarian cyst/follicle, likely physiologic.  No color Doppler flow is visualized in the right ovary, although this may be technical. No associated ovarian enlargement/edema.  Normal sonographic appearance of the left ovary.   Electronically Signed   By: Julian Hy M.D.   On: 12/03/2014 17:18   Results for orders placed or performed during the hospital encounter of 12/03/14 (from the past 24 hour(s))  Urinalysis, Routine w reflex microscopic (not at [facility])     Status: Abnormal   Collection Time: 12/03/14  2:05 PM  Result Value Ref Range   Color, Urine YELLOW YELLOW   APPearance CLEAR CLEAR   Specific Gravity, Urine 1.010 1.005 - 1.030   pH 5.5 5.0 - 8.0   Glucose, UA NEGATIVE NEGATIVE mg/dL   Hgb urine dipstick MODERATE (A) NEGATIVE   Bilirubin Urine NEGATIVE NEGATIVE   Ketones, ur NEGATIVE NEGATIVE mg/dL   Protein,  ur NEGATIVE NEGATIVE mg/dL   Urobilinogen, UA 0.2 0.0 - 1.0 mg/dL   Nitrite NEGATIVE NEGATIVE   Leukocytes, UA NEGATIVE NEGATIVE  Urine microscopic-add on     Status: Abnormal   Collection Time: 12/03/14  2:05 PM  Result Value Ref Range   Squamous Epithelial / LPF FEW (A) RARE   WBC, UA 0-2 <3 WBC/hpf   RBC / HPF 0-2 <3 RBC/hpf   Bacteria, UA RARE RARE  CBC     Status: Abnormal   Collection Time: 12/03/14  2:23 PM  Result Value Ref Range   WBC 14.7 (H) 4.0 - 10.5 K/uL   RBC 4.52 3.87 - 5.11 MIL/uL   Hemoglobin 10.7 (L)  12.0 - 15.0 g/dL   HCT 33.9 (L) 36.0 - 46.0 %   MCV 75.0 (L) 78.0 - 100.0 fL   MCH 23.7 (L) 26.0 - 34.0 pg   MCHC 31.6 30.0 - 36.0 g/dL   RDW 25.6 (H) 11.5 - 15.5 %   Platelets 342 150 - 400 K/uL  Pregnancy, urine POC     Status: None   Collection Time: 12/03/14  2:25 PM  Result Value Ref Range   Preg Test, Ur NEGATIVE NEGATIVE    Discussed with Dr. Ihor Dow- will send home with pain med- return  Dr. Ihor Dow to contact Dr. Roselie Awkward to see if can go ahead and proceed with TAH Assessment and Plan  Abdominal pain Fibroids Percocet Zofran F/u in clinic  [facility] 12/03/2014, 2:09 PM

## 2014-12-03 NOTE — Telephone Encounter (Signed)
Pt stopped by clinic in tears. She stated that she is having very severe pain and nothing is helping it. Patient reports pain is a 10 constantly. I advised patient to go to MAU for evaluation.

## 2014-12-03 NOTE — MAU Note (Addendum)
since yesterday, pain in pelvic bone and in fold of rt leg, now radiating to back.  Can't sit stand or lay down.  No fever, no vomiting, no diarrhea, some nausea, no pain with urination., feels better when presses on the area

## 2014-12-09 ENCOUNTER — Encounter: Payer: Self-pay | Admitting: Obstetrics & Gynecology

## 2014-12-09 ENCOUNTER — Ambulatory Visit (INDEPENDENT_AMBULATORY_CARE_PROVIDER_SITE_OTHER): Payer: Self-pay | Admitting: Obstetrics & Gynecology

## 2014-12-09 VITALS — BP 110/77 | HR 89 | Temp 98.8°F | Resp 20 | Ht 59.0 in | Wt 160.0 lb

## 2014-12-09 DIAGNOSIS — D259 Leiomyoma of uterus, unspecified: Secondary | ICD-10-CM

## 2014-12-09 NOTE — Progress Notes (Signed)
Subjective:     Patient ID: Angela Ruiz, female   DOB: 07-01-1971, 44 y.o.   MRN: [id number]  HPIcc: lower abdominal pain [id number] Patient's last menstrual period was 11/13/2014. H/O fibroid uterus and is seeking assistance to have TAH, seen in MAU 6/2 with worse RLQ pain. [place] was done, note follows, was reviewed   Expand All Collapse All    History     CSN: [id number]  Arrival date and time: 12/03/14 1326  None    Chief Complaint  Patient presents with  . Pelvic Pain   HPI  Pt is 44 yo postmenopausal who presents with lower abdominal pain Pt has known fibroids and is scheduled for hysterectomy Pt has been doing well until yesterday when had acute right lower quadrant pain-no bleeding- pt took megace and according to chart called back and said it made her cramping/pain worse and was changed to provera Hurts to walk- pt has decreased appetite but no vomiting; no constipation or diarrhea or UTI sx. Pt has not taken anything for the pain- pt states it feels better with pressure Pt denies fever   Registered Nurse Addendum  MAU Note 12/03/2014 1:51 PM    Expand All Collapse All  since yesterday, pain in pelvic bone and in fold of rt leg, now radiating to back. Can't sit stand or lay down. No fever, no vomiting, no diarrhea, some nausea, no pain with urination., feels better when presses on the area      Past Medical History  Diagnosis Date  . Hypertension     Past Surgical History  Procedure Laterality Date  . Cesarean section      2007    Family History  Problem Relation Age of Onset  . Alcohol abuse Neg Hx   . Arthritis Neg Hx   . Asthma Neg Hx   . Birth defects Neg Hx   . Cancer Neg Hx   . COPD Neg Hx   . Depression Neg Hx   . Diabetes Neg Hx   . Drug abuse Neg Hx   . Early death Neg Hx   . Hearing loss Neg Hx   . Heart disease Neg Hx   .  Hyperlipidemia Neg Hx   . Hypertension Neg Hx   . Kidney disease Neg Hx   . Learning disabilities Neg Hx   . Mental illness Neg Hx   . Mental retardation Neg Hx   . Miscarriages / Stillbirths Neg Hx   . Stroke Neg Hx   . Vision loss Neg Hx   . Varicose Veins Neg Hx     History  Substance Use Topics  . Smoking status: Former Smoker    Quit date: 07/14/2003  . Smokeless tobacco: Not on file  . Alcohol Use: No    Allergies:  Allergies  Allergen Reactions  . Eggs Or Egg-Derived Products Nausea And Vomiting    Prescriptions prior to admission  Medication Sig Dispense Refill Last Dose  . ibuprofen (ADVIL,MOTRIN) 600 MG tablet Take 1 tablet (600 mg total) by mouth every 6 (six) hours as needed. 30 tablet 1 Taking  . medroxyPROGESTERone (PROVERA) 10 MG tablet Take 2 tablets (20 mg total) by mouth daily. 30 tablet 4   . megestrol (MEGACE) 20 MG tablet Take 2 tablets (40 mg total) by mouth 2 (two) times daily. Can increase to two tablets twice a day in the event of heavy bleeding 60 tablet 4     Review of Systems  Constitutional: Negative for fever  and chills.  Gastrointestinal: Positive for nausea and abdominal pain. Negative for vomiting, diarrhea and constipation.  Genitourinary: Negative for dysuria and urgency.   Physical Exam   Blood pressure 108/84, pulse 98, temperature 99.8 F (37.7 C), temperature source Oral, resp. rate 20, height 4' 10.5" (1.486 m), weight 157 lb (71.215 kg), last menstrual period 11/21/2014.  Physical Exam  Nursing note and vitals reviewed. Constitutional: She is oriented to person, place, and time. She appears well-developed and well-nourished. No distress.  HENT:  Head: Normocephalic.  Eyes: Pupils are equal, round, and reactive to light.  Neck: Normal range of motion. Neck supple.  Cardiovascular: Normal rate.  Respiratory: Effort normal.  GI: Soft.  She exhibits mass. She exhibits no distension. There is tenderness. There is guarding. There is no rebound.  Genitourinary:  bimanua- prominent right adnexal mass-size of golf ball- extremely painful to touch  Musculoskeletal: Normal range of motion.  Neurological: She is alert and oriented to person, place, and time.  Skin: Skin is warm and dry.  Psychiatric: She has a normal mood and affect.    MAU Course  Procedures CBC  Toradol 60mg  IM US  Imaging Results (Last 48 hours)    US Pelvis Complete  12/03/2014 CLINICAL DATA: Right lower quadrant pain x2 days EXAM: TRANSABDOMINAL ULTRASOUND OF PELVIS TECHNIQUE: Transabdominal ultrasound examination of the pelvis was performed including evaluation of the uterus, ovaries, adnexal regions, and pelvic cul-de-sac. COMPARISON: 10/13/2014 FINDINGS: Uterus Measurements: 15.0 x 9.1 x 8.5 cm. Multiple uterine fibroids, including a dominant 10.6 x 8.5 x 10.0 cm subserosal right posterior fundal fibroid. Endometrium Thickness: 7 mm. No focal abnormality visualized. Right ovary Measurements: 4.0 x 1.6 x 3.0 cm. Dominant 2.0 cm simple cyst/follicle. No color Doppler flow is visualized, although this may be technical. No ovarian enlargement/edema is noted on grayscale evaluation. Left ovary Measurements: 2.4 x 2.0 x 2.1 cm. Normal appearance/no adnexal mass. Normal color Doppler flow. Other findings: No free fluid. Transvaginal pelvic evaluation was not performed. Notably, the right ovary was not visualized on prior transvaginal study due to high abdominal position. IMPRESSION: Multiple uterine fibroids, including a dominant 10.6 cm subserosal right posterior fundal fibroid. 2.0 cm simple right ovarian cyst/follicle, likely physiologic. No color Doppler flow is visualized in the right ovary, although this may be technical. No associated ovarian enlargement/edema. Normal sonographic appearance of the left ovary. Electronically Signed By:  Julian Hy M.D. On: 12/03/2014 17:18     Lab Results Last 24 Hours    Results for orders placed or performed during the hospital encounter of 12/03/14 (from the past 24 hour(s))  Urinalysis, Routine w reflex microscopic (not at [facility]) Status: Abnormal   Collection Time: 12/03/14 2:05 PM  Result Value Ref Range   Color, Urine YELLOW YELLOW   APPearance CLEAR CLEAR   Specific Gravity, Urine 1.010 1.005 - 1.030   pH 5.5 5.0 - 8.0   Glucose, UA NEGATIVE NEGATIVE mg/dL   Hgb urine dipstick MODERATE (A) NEGATIVE   Bilirubin Urine NEGATIVE NEGATIVE   Ketones, ur NEGATIVE NEGATIVE mg/dL   Protein, ur NEGATIVE NEGATIVE mg/dL   Urobilinogen, UA 0.2 0.0 - 1.0 mg/dL   Nitrite NEGATIVE NEGATIVE   Leukocytes, UA NEGATIVE NEGATIVE  Urine microscopic-add on Status: Abnormal   Collection Time: 12/03/14 2:05 PM  Result Value Ref Range   Squamous Epithelial / LPF FEW (A) RARE   WBC, UA 0-2 <3 WBC/hpf   RBC / HPF 0-2 <3 RBC/hpf   Bacteria, UA RARE RARE  CBC  Status: Abnormal   Collection Time: 12/03/14 2:23 PM  Result Value Ref Range   WBC 14.7 (H) 4.0 - 10.5 K/uL   RBC 4.52 3.87 - 5.11 MIL/uL   Hemoglobin 10.7 (L) 12.0 - 15.0 g/dL   HCT 33.9 (L) 36.0 - 46.0 %   MCV 75.0 (L) 78.0 - 100.0 fL   MCH 23.7 (L) 26.0 - 34.0 pg   MCHC 31.6 30.0 - 36.0 g/dL   RDW 25.6 (H) 11.5 - 15.5 %   Platelets 342 150 - 400 K/uL  Pregnancy, urine POC Status: None   Collection Time: 12/03/14 2:25 PM  Result Value Ref Range   Preg Test, Ur NEGATIVE NEGATIVE      Discussed with Dr. Ihor Dow- will send home with pain med- return  Dr. Ihor Dow to contact Dr. Roselie Awkward to see if can go ahead and proceed with TAH Assessment and Plan  Abdominal pain Fibroids Percocet Zofran F/u in clinic  [facility] 12/03/2014, 2:09 PM         Review of  Systems  Constitutional: Negative.   Gastrointestinal: Positive for abdominal pain. Negative for nausea and constipation.  Genitourinary: Positive for menstrual problem and pelvic pain. Negative for vaginal bleeding and vaginal discharge.       Objective:   Physical Exam  Constitutional: She is oriented to person, place, and time. She appears well-developed. No distress.  Cardiovascular: Normal rate.   Pulmonary/Chest: Effort normal.  Abdominal: Soft. She exhibits mass (firm 14 week size uterus mild tendermess). There is tenderness.  Musculoskeletal: Normal range of motion.  Neurological: She is alert and oriented to person, place, and time.  Skin: Skin is warm and dry.  Psychiatric: She has a normal mood and affect. Her behavior is normal.  Vitals reviewed.      Assessment:     Pelvic pain and menorrhagia and fibroid uterus to have TAH      Plan:     Will schedule surgery once assistance effective, pt will call     Woodroe Mode, MD 12/09/2014

## 2014-12-09 NOTE — Progress Notes (Signed)
Interpreter Rubye [place] present for encounter. Pt reports having severe abdominal pain last week and went to MAU for evaluation.

## 2014-12-09 NOTE — Patient Instructions (Signed)
Fibroma uterino (Uterine Fibroid) Un fibroma uterino es un crecimiento (tumor) dentro del tero. Este tipo de tumor no es Radio broadcast assistant y no se extiende fuera del tero. Podr tener uno o varios fibromas. Los fibromas pueden variar en tamao, peso y TEFL teacher en que se desarrollan dentro del tero. Algunos pueden llegar a ser bastante grandes. La mayora de los fibromas no necesitan tratamiento mdico, pero algunos pueden causar dolor o sangrado abundante durante los perodos y [place]. CAUSAS  Un fibroma es el resultado del desarrollo continuo de una nica clula uterina que sigue creciendo (no regulada) que es diferente al resto de las clulas del cuerpo humano. La mayora de las clulas tiene un mecanismo de control que evita que se reproduzcan de Research officer, trade union.  SIGNOS Y SNTOMAS   Hemorragias.  Dolor y sensacin de presin en la pelvis.  Problemas en la vejiga debido al tamao del fibroma.  Infertilidad y abortos espontneos, segn el tamao y la ubicacin del fibroma. DIAGNSTICO  Los fibromas uterinos se diagnostican con un examen fsico. El mdico puede palpar los tumores abultados al realizar el examen de la pelvis. Una ecografa puede indicarse para tener informacin del tamao, la ubicacin y el nmero de tumores.  TRATAMIENTO   El mdico puede considerar que es conveniente esperar y Barrister's clerk. Esto incluye el control del fibroma por parte del mdico para observar si crece o disminuye su tamao.  Podr indicarle un tratamiento hormonal o el uso de un dispositivo intrauterino (DIU).  En algunos casos es necesaria la ciruga para extirpar el fibroma (miomectoma) o el tero (histerectoma). Esto depender de su situacin. Cuando una mujer desea quedar embarazada y los fibromas interfieren en su fertilidad, el mdico puede recomendar la extirpacin del fibroma.  INSTRUCCIONES PARA EL CUIDADO EN EL HOGAR  Los cuidados en el hogar dependen del tratamiento que haya  recibido. En general:   Cumpla con todas las visitas de control, segn le indique su mdico.  Tome slo medicamentos de venta libre o recetados, segn las indicaciones del mdico. Si le recetaron un tratamiento hormonal, tome los medicamentos hormonales como le indicaron. No tome aspirina. Puede ocasionar hemorragias.  Consulte al mdico si debe tomar pldoras de hierro.  Si sus perodos son molestos pero no tan abundantes, acustese con los pies ligeramente elevados por encima del nivel del corazn. Coloque compresas fras en la zona inferior del abdomen.  Si sus perodos son muy abundantes, anote el nmero de compresas o tampones que [place] cada mes. Lleve esta informacin a su consulta mdica.  Incluya vegetales verdes en su dieta. SOLICITE ATENCIN MDICA DE INMEDIATO SI:  Siente dolor o clicos en la pelvis y no puede controlarlos con los medicamentos.  El dolor en la pelvis aumenta de manera repentina.  Aumenta el sangrado entre los perodos o [employer].  Si tiene perodos muy abundantes y debe cambiar un tampn o una toalla higinica cada media hora o menos.  Se siente mareado o tiene episodios de [place]. Document Released: 06/19/2005 Document Revised: 04/09/2013 [facility] Patient Information 2015 [place], [place]. This information is not intended to replace advice given to you by your health care provider. Make sure you discuss any questions you have with your health care provider.

## 2014-12-28 ENCOUNTER — Telehealth: Payer: Self-pay | Admitting: *Deleted

## 2014-12-28 NOTE — Telephone Encounter (Signed)
Patient called and left message in spanish would like call back.

## 2014-12-30 NOTE — Telephone Encounter (Signed)
Spoke with patient in regards to her financial application. Found out through Cathlean Marseilles, RN that patient is covered 100% message sent to Dr. Roselie Awkward to inform him of patients status. Beronica called patient back and informed patient of her status. Patient had no further questions.

## 2014-12-30 NOTE — Telephone Encounter (Signed)
Called patient back with [place] interpreter.

## 2015-02-10 ENCOUNTER — Encounter (HOSPITAL_COMMUNITY): Payer: Self-pay | Admitting: *Deleted

## 2015-02-27 ENCOUNTER — Other Ambulatory Visit: Payer: Self-pay | Admitting: Obstetrics & Gynecology

## 2015-03-02 ENCOUNTER — Telehealth: Payer: Self-pay | Admitting: General Practice

## 2015-03-02 NOTE — Telephone Encounter (Signed)
Patient's daughter called and left message stating her mother needs a refill on her medroxy. Per chart review, refill was sent yesterday. Angela Ruiz called patient and informed her of Rx sent to pharmacy. Patient asked if she should continue medication before her surgery. Instructed patient that she should. Patient verbalized understanding and had no questions

## 2015-03-09 ENCOUNTER — Encounter (HOSPITAL_COMMUNITY): Payer: Self-pay

## 2015-03-09 ENCOUNTER — Encounter (HOSPITAL_COMMUNITY)
Admission: RE | Admit: 2015-03-09 | Discharge: 2015-03-09 | Disposition: A | Discharge status: Home / Self Care | Payer: Self-pay | Source: Ambulatory Visit | Attending: Obstetrics & Gynecology | Admitting: Obstetrics & Gynecology

## 2015-03-09 DIAGNOSIS — Z01812 Encounter for preprocedural laboratory examination: Secondary | ICD-10-CM | POA: Insufficient documentation

## 2015-03-09 DIAGNOSIS — I10 Essential (primary) hypertension: Secondary | ICD-10-CM | POA: Insufficient documentation

## 2015-03-09 HISTORY — DX: Headache, unspecified: R51.9

## 2015-03-09 HISTORY — DX: Headache: R51

## 2015-03-09 HISTORY — DX: Anemia, unspecified: D64.9

## 2015-03-09 LAB — CBC
HEMATOCRIT: 40.3 % (ref 36.0–46.0)
HEMOGLOBIN: 13.3 g/dL (ref 12.0–15.0)
MCH: 28.5 pg (ref 26.0–34.0)
MCHC: 33 g/dL (ref 30.0–36.0)
MCV: 86.5 fL (ref 78.0–100.0)
Platelets: 315 10*3/uL (ref 150–400)
RBC: 4.66 MIL/uL (ref 3.87–5.11)
RDW: 15.1 % (ref 11.5–15.5)
WBC: 9.6 10*3/uL (ref 4.0–10.5)

## 2015-03-09 NOTE — Patient Instructions (Signed)
Your procedure is scheduled on:  March 15, 2015   Enter through the Main Entrance of [facility] at:  11:30 am   Pick up the phone at the desk and dial [phone].  Call this number if you have problems the morning of surgery: [phone].  Remember: Do NOT eat food:  After midnight on Sunday night  Do NOT drink clear liquids after:  9:00 am day of surgery  Take these medicines the morning of surgery with a SIP OF WATER: none   Do NOT wear jewelry (body piercing), metal hair clips/bobby pins, make-up, or nail polish. Do NOT wear lotions, powders, or perfumes.  You may wear deoderant. Do NOT shave for 48 hours prior to surgery. Do NOT bring valuables to the hospital. Contacts, dentures, or bridgework may not be worn into surgery. Leave suitcase in car.  After surgery it may be brought to your room.  For patients admitted to the hospital, checkout time is 11:00 AM the day of discharge.

## 2015-03-12 ENCOUNTER — Other Ambulatory Visit: Payer: Self-pay | Admitting: Obstetrics & Gynecology

## 2015-03-15 ENCOUNTER — Inpatient Hospital Stay (HOSPITAL_COMMUNITY): Payer: Self-pay | Admitting: Anesthesiology

## 2015-03-15 ENCOUNTER — Encounter (HOSPITAL_COMMUNITY): Payer: Self-pay | Admitting: Emergency Medicine

## 2015-03-15 ENCOUNTER — Inpatient Hospital Stay (HOSPITAL_COMMUNITY)
Admission: RE | Admit: 2015-03-15 | Discharge: 2015-03-17 | DRG: 743 | Disposition: A | Discharge status: Home / Self Care | Payer: Self-pay | Source: Ambulatory Visit | Attending: Obstetrics & Gynecology | Admitting: Obstetrics & Gynecology

## 2015-03-15 ENCOUNTER — Encounter (HOSPITAL_COMMUNITY)
Admission: RE | Disposition: A | Discharge status: Home / Self Care | Payer: Self-pay | Source: Ambulatory Visit | Attending: Obstetrics & Gynecology

## 2015-03-15 DIAGNOSIS — N920 Excessive and frequent menstruation with regular cycle: Principal | ICD-10-CM | POA: Diagnosis present

## 2015-03-15 DIAGNOSIS — D259 Leiomyoma of uterus, unspecified: Secondary | ICD-10-CM | POA: Diagnosis present

## 2015-03-15 DIAGNOSIS — I10 Essential (primary) hypertension: Secondary | ICD-10-CM | POA: Diagnosis present

## 2015-03-15 DIAGNOSIS — Z87891 Personal history of nicotine dependence: Secondary | ICD-10-CM

## 2015-03-15 DIAGNOSIS — N946 Dysmenorrhea, unspecified: Secondary | ICD-10-CM | POA: Diagnosis present

## 2015-03-15 HISTORY — PX: BILATERAL SALPINGECTOMY: SHX5743

## 2015-03-15 HISTORY — PX: SUPRACERVICAL ABDOMINAL HYSTERECTOMY: SHX5393

## 2015-03-15 LAB — TYPE AND SCREEN
ABO/RH(D): A POS
Antibody Screen: NEGATIVE

## 2015-03-15 LAB — PREGNANCY, URINE: Preg Test, Ur: NEGATIVE

## 2015-03-15 SURGERY — HYSTERECTOMY, SUPRACERVICAL, ABDOMINAL
Anesthesia: General | Site: Abdomen

## 2015-03-15 MED ORDER — ONDANSETRON HCL 4 MG/2ML IJ SOLN: 4.0000 mg | Freq: Once | INTRAMUSCULAR | Status: DC | PRN

## 2015-03-15 MED ORDER — NALOXONE HCL 0.4 MG/ML IJ SOLN: 0.4000 mg | INTRAMUSCULAR | Status: DC | PRN

## 2015-03-15 MED ORDER — NEOSTIGMINE METHYLSULFATE 10 MG/10ML IV SOLN
INTRAVENOUS | Status: AC
  Filled 2015-03-15: qty 1

## 2015-03-15 MED ORDER — PROPOFOL 10 MG/ML IV BOLUS
INTRAVENOUS | Status: DC | PRN
  Administered 2015-03-15: 30 mg via INTRAVENOUS
  Administered 2015-03-15: 150 mg via INTRAVENOUS
  Administered 2015-03-15: 20 mg via INTRAVENOUS

## 2015-03-15 MED ORDER — HYDROMORPHONE HCL 1 MG/ML IJ SOLN: 0.2500 mg | INTRAMUSCULAR | Status: DC | PRN

## 2015-03-15 MED ORDER — HYDROMORPHONE HCL 1 MG/ML IJ SOLN
INTRAMUSCULAR | Status: AC
  Filled 2015-03-15: qty 1

## 2015-03-15 MED ORDER — ROCURONIUM BROMIDE 100 MG/10ML IV SOLN
INTRAVENOUS | Status: DC | PRN
  Administered 2015-03-15: 10 mg via INTRAVENOUS
  Administered 2015-03-15: 5 mg via INTRAVENOUS
  Administered 2015-03-15: 35 mg via INTRAVENOUS
  Administered 2015-03-15 (×2): 10 mg via INTRAVENOUS

## 2015-03-15 MED ORDER — DEXAMETHASONE SODIUM PHOSPHATE 4 MG/ML IJ SOLN
INTRAMUSCULAR | Status: AC
  Filled 2015-03-15: qty 1

## 2015-03-15 MED ORDER — HYDROMORPHONE 0.3 MG/ML IV SOLN
INTRAVENOUS | Status: DC
  Administered 2015-03-15: 1.79 mg via INTRAVENOUS
  Administered 2015-03-15: 17:00:00 via INTRAVENOUS
  Administered 2015-03-16: 0.999 mg via INTRAVENOUS
  Administered 2015-03-16: 2.59 mg via INTRAVENOUS
  Filled 2015-03-15: qty 25

## 2015-03-15 MED ORDER — BUPIVACAINE HCL (PF) 0.5 % IJ SOLN
INTRAMUSCULAR | Status: DC | PRN
  Administered 2015-03-15: 30 mL

## 2015-03-15 MED ORDER — CEFAZOLIN SODIUM-DEXTROSE 2-3 GM-% IV SOLR
INTRAVENOUS | Status: AC
  Filled 2015-03-15: qty 50

## 2015-03-15 MED ORDER — BUPIVACAINE HCL (PF) 0.5 % IJ SOLN
INTRAMUSCULAR | Status: AC
  Filled 2015-03-15: qty 30

## 2015-03-15 MED ORDER — LIDOCAINE HCL (CARDIAC) 20 MG/ML IV SOLN
INTRAVENOUS | Status: AC
  Filled 2015-03-15: qty 5

## 2015-03-15 MED ORDER — MIDAZOLAM HCL 2 MG/2ML IJ SOLN
INTRAMUSCULAR | Status: AC
Start: 2015-03-15 — End: 2015-03-15
  Filled 2015-03-15: qty 4

## 2015-03-15 MED ORDER — DIPHENHYDRAMINE HCL 50 MG/ML IJ SOLN
INTRAMUSCULAR | Status: AC
  Filled 2015-03-15: qty 1

## 2015-03-15 MED ORDER — SODIUM CHLORIDE 0.9 % IJ SOLN: 9.0000 mL | INTRAMUSCULAR | Status: DC | PRN

## 2015-03-15 MED ORDER — LIDOCAINE HCL (CARDIAC) 20 MG/ML IV SOLN
INTRAVENOUS | Status: DC | PRN
  Administered 2015-03-15: 60 mg via INTRAVENOUS

## 2015-03-15 MED ORDER — FENTANYL CITRATE (PF) 100 MCG/2ML IJ SOLN
INTRAMUSCULAR | Status: AC
  Filled 2015-03-15: qty 2

## 2015-03-15 MED ORDER — ONDANSETRON HCL 4 MG PO TABS: 4.0000 mg | ORAL_TABLET | Freq: Four times a day (QID) | ORAL | Status: DC | PRN

## 2015-03-15 MED ORDER — DEXAMETHASONE SODIUM PHOSPHATE 4 MG/ML IJ SOLN
INTRAMUSCULAR | Status: DC | PRN
  Administered 2015-03-15: 4 mg via INTRAVENOUS

## 2015-03-15 MED ORDER — ROCURONIUM BROMIDE 100 MG/10ML IV SOLN
INTRAVENOUS | Status: AC
  Filled 2015-03-15: qty 1

## 2015-03-15 MED ORDER — FENTANYL CITRATE (PF) 250 MCG/5ML IJ SOLN
INTRAMUSCULAR | Status: DC | PRN
  Administered 2015-03-15 (×7): 50 ug via INTRAVENOUS

## 2015-03-15 MED ORDER — LACTATED RINGERS IV SOLN
INTRAVENOUS | Status: DC
  Administered 2015-03-15 – 2015-03-16 (×2): via INTRAVENOUS

## 2015-03-15 MED ORDER — HYDROMORPHONE HCL 1 MG/ML IJ SOLN
0.2500 mg | INTRAMUSCULAR | Status: DC | PRN
  Administered 2015-03-15 (×2): 0.5 mg via INTRAVENOUS

## 2015-03-15 MED ORDER — DIPHENHYDRAMINE HCL 12.5 MG/5ML PO ELIX
12.5000 mg | ORAL_SOLUTION | Freq: Four times a day (QID) | ORAL | Status: DC | PRN
  Filled 2015-03-15: qty 5

## 2015-03-15 MED ORDER — MIDAZOLAM HCL 2 MG/2ML IJ SOLN
INTRAMUSCULAR | Status: DC | PRN
  Administered 2015-03-15: 2 mg via INTRAVENOUS

## 2015-03-15 MED ORDER — GLYCOPYRROLATE 0.2 MG/ML IJ SOLN
INTRAMUSCULAR | Status: AC
  Filled 2015-03-15: qty 4

## 2015-03-15 MED ORDER — GLYCOPYRROLATE 0.2 MG/ML IJ SOLN
INTRAMUSCULAR | Status: DC | PRN
  Administered 2015-03-15: 0.6 mg via INTRAVENOUS
  Administered 2015-03-15: 0.1 mg via INTRAVENOUS

## 2015-03-15 MED ORDER — NEOSTIGMINE METHYLSULFATE 10 MG/10ML IV SOLN
INTRAVENOUS | Status: DC | PRN
  Administered 2015-03-15: 3 mg via INTRAVENOUS

## 2015-03-15 MED ORDER — DIPHENHYDRAMINE HCL 50 MG/ML IJ SOLN
12.5000 mg | Freq: Four times a day (QID) | INTRAMUSCULAR | Status: DC | PRN
  Administered 2015-03-15 – 2015-03-16 (×3): 12.5 mg via INTRAVENOUS
  Filled 2015-03-15 (×2): qty 1

## 2015-03-15 MED ORDER — PROPOFOL 10 MG/ML IV BOLUS
INTRAVENOUS | Status: AC
  Filled 2015-03-15: qty 20

## 2015-03-15 MED ORDER — LACTATED RINGERS IV SOLN: INTRAVENOUS | Status: DC

## 2015-03-15 MED ORDER — LACTATED RINGERS IV SOLN
INTRAVENOUS | Status: DC
  Administered 2015-03-15 (×3): via INTRAVENOUS

## 2015-03-15 MED ORDER — ONDANSETRON HCL 4 MG/2ML IJ SOLN: 4.0000 mg | Freq: Four times a day (QID) | INTRAMUSCULAR | Status: DC | PRN

## 2015-03-15 MED ORDER — SCOPOLAMINE 1 MG/3DAYS TD PT72
MEDICATED_PATCH | TRANSDERMAL | Status: DC
Start: 2015-03-15 — End: 2015-03-17
  Administered 2015-03-15: 1.5 mg via TRANSDERMAL
  Filled 2015-03-15: qty 1

## 2015-03-15 MED ORDER — HYDROMORPHONE HCL 1 MG/ML IJ SOLN
INTRAMUSCULAR | Status: DC | PRN
  Administered 2015-03-15: 0.5 mg via INTRAVENOUS
  Administered 2015-03-15: 1 mg via INTRAVENOUS
  Administered 2015-03-15: 0.5 mg via INTRAVENOUS

## 2015-03-15 MED ORDER — FENTANYL CITRATE (PF) 250 MCG/5ML IJ SOLN
INTRAMUSCULAR | Status: AC
  Filled 2015-03-15: qty 25

## 2015-03-15 MED ORDER — ONDANSETRON HCL 4 MG/2ML IJ SOLN
INTRAMUSCULAR | Status: DC | PRN
  Administered 2015-03-15: 4 mg via INTRAVENOUS

## 2015-03-15 MED ORDER — FENTANYL CITRATE (PF) 100 MCG/2ML IJ SOLN
INTRAMUSCULAR | Status: AC
  Filled 2015-03-15: qty 4

## 2015-03-15 MED ORDER — SODIUM CHLORIDE 0.9 % IR SOLN
Status: DC | PRN
  Administered 2015-03-15: 3000 mL

## 2015-03-15 MED ORDER — SCOPOLAMINE 1 MG/3DAYS TD PT72
1.0000 | MEDICATED_PATCH | Freq: Once | TRANSDERMAL | Status: DC
  Administered 2015-03-15: 1.5 mg via TRANSDERMAL

## 2015-03-15 MED ORDER — ONDANSETRON HCL 4 MG/2ML IJ SOLN
INTRAMUSCULAR | Status: AC
  Filled 2015-03-15: qty 2

## 2015-03-15 MED ORDER — CEFAZOLIN SODIUM-DEXTROSE 2-3 GM-% IV SOLR
2.0000 g | INTRAVENOUS | Status: AC
  Administered 2015-03-15: 2 g via INTRAVENOUS

## 2015-03-15 MED ORDER — OXYCODONE-ACETAMINOPHEN 5-325 MG PO TABS
1.0000 | ORAL_TABLET | ORAL | Status: DC | PRN
  Administered 2015-03-16 – 2015-03-17 (×6): 2 via ORAL
  Filled 2015-03-15 (×6): qty 2

## 2015-03-15 SURGICAL SUPPLY — 38 items
CANISTER SUCT 3000ML (MISCELLANEOUS) ×3 IMPLANT
CLOTH BEACON ORANGE TIMEOUT ST (SAFETY) ×3 IMPLANT
CONT PATH 16OZ SNAP LID 3702 (MISCELLANEOUS) ×3 IMPLANT
DECANTER SPIKE VIAL GLASS SM (MISCELLANEOUS) ×3 IMPLANT
DRAPE WARM FLUID 44X44 (DRAPE) ×3 IMPLANT
DRSG OPSITE POSTOP 4X10 (GAUZE/BANDAGES/DRESSINGS) ×3 IMPLANT
DURAPREP 26ML APPLICATOR (WOUND CARE) ×3 IMPLANT
GAUZE SPONGE 4X4 16PLY XRAY LF (GAUZE/BANDAGES/DRESSINGS) ×3 IMPLANT
GLOVE BIO SURGEON STRL SZ 6.5 (GLOVE) ×3 IMPLANT
GLOVE BIO SURGEON STRL SZ7.5 (GLOVE) ×6 IMPLANT
GLOVE BIOGEL PI IND STRL 6.5 (GLOVE) ×6 IMPLANT
GLOVE BIOGEL PI IND STRL 7.0 (GLOVE) ×6 IMPLANT
GLOVE BIOGEL PI INDICATOR 6.5 (GLOVE) ×3
GLOVE BIOGEL PI INDICATOR 7.0 (GLOVE) ×3
GLOVE SURG SS PI 6.5 STRL IVOR (GLOVE) ×15 IMPLANT
GLOVE SURG SS PI 7.0 STRL IVOR (GLOVE) ×18 IMPLANT
GOWN STRL REUS W/TWL LRG LVL3 (GOWN DISPOSABLE) ×12 IMPLANT
NEEDLE HYPO 22GX1.5 SAFETY (NEEDLE) ×3 IMPLANT
NEEDLE KEITH (NEEDLE) ×3 IMPLANT
NS IRRIG 1000ML POUR BTL (IV SOLUTION) ×3 IMPLANT
PACK ABDOMINAL GYN (CUSTOM PROCEDURE TRAY) ×3 IMPLANT
PAD OB MATERNITY 4.3X12.25 (PERSONAL CARE ITEMS) ×3 IMPLANT
PENCIL SMOKE EVAC W/HOLSTER (ELECTROSURGICAL) ×3 IMPLANT
PROTECTOR NERVE ULNAR (MISCELLANEOUS) ×3 IMPLANT
SPONGE LAP 18X18 X RAY DECT (DISPOSABLE) ×9 IMPLANT
SUT VIC AB 0 CT1 18XCR BRD8 (SUTURE) ×8 IMPLANT
SUT VIC AB 0 CT1 27 (SUTURE) ×1
SUT VIC AB 0 CT1 27XBRD ANBCTR (SUTURE) ×2 IMPLANT
SUT VIC AB 0 CT1 36 (SUTURE) ×6 IMPLANT
SUT VIC AB 0 CT1 8-18 (SUTURE) ×4
SUT VIC AB 2-0 CT1 27 (SUTURE) ×1
SUT VIC AB 2-0 CT1 TAPERPNT 27 (SUTURE) ×2 IMPLANT
SUT VIC AB 4-0 PS2 27 (SUTURE) ×3 IMPLANT
SUT VICRYL 0 TIES 12 18 (SUTURE) ×3 IMPLANT
SYR CONTROL 10ML LL (SYRINGE) ×3 IMPLANT
TOWEL OR 17X24 6PK STRL BLUE (TOWEL DISPOSABLE) ×9 IMPLANT
TRAY FOLEY CATH SILVER 14FR (SET/KITS/TRAYS/PACK) ×3 IMPLANT
YANKAUER SUCT BULB TIP NO VENT (SUCTIONS) ×3 IMPLANT

## 2015-03-15 NOTE — Anesthesia Procedure Notes (Signed)
Procedure Name: Intubation Date/Time: 03/15/2015 1:27 PM Performed by: Flossie Dibble Pre-anesthesia Checklist: Suction available, Emergency Drugs available, Timeout performed, Patient being monitored and Patient identified Patient Re-evaluated:Patient Re-evaluated prior to inductionOxygen Delivery Method: Circle system utilized Preoxygenation: Pre-oxygenation with 100% oxygen Intubation Type: IV induction Ventilation: Mask ventilation without difficulty Laryngoscope Size: Mac and 3 Grade View: Grade I Number of attempts: 1 Intubation method: 4 bath blankets under upper back + 1 & foam head cradle for optimal  alignment. Placement Confirmation: ETT inserted through vocal cords under direct vision,  positive ETCO2 and breath sounds checked- equal and bilateral Secured at: 21 cm Tube secured with: Tape Dental Injury: Teeth and Oropharynx as per pre-operative assessment

## 2015-03-15 NOTE — Anesthesia Preprocedure Evaluation (Signed)
Anesthesia Evaluation  Patient identified by MRN, date of birth, ID band Patient awake    Reviewed: Allergy & Precautions, H&P , NPO status , Patient's Chart, lab work & pertinent test results  Airway Mallampati: III  TM Distance: >3 FB Neck ROM: full    Dental no notable dental hx. (+) Teeth Intact   Pulmonary former smoker,    Pulmonary exam normal        Cardiovascular Normal cardiovascular exam     Neuro/Psych    GI/Hepatic negative GI ROS, Neg liver ROS,   Endo/Other  negative endocrine ROS  Renal/GU negative Renal ROS     Musculoskeletal   Abdominal (+) + obese,   Peds  Hematology   Anesthesia Other Findings   Reproductive/Obstetrics negative OB ROS                             Anesthesia Physical Anesthesia Plan  ASA: II  Anesthesia Plan: General   Post-op Pain Management:    Induction: Intravenous  Airway Management Planned: Oral ETT  Additional Equipment:   Intra-op Plan:   Post-operative Plan: Extubation in OR  Informed Consent: I have reviewed the patients History and Physical, chart, labs and discussed the procedure including the risks, benefits and alternatives for the proposed anesthesia with the patient or authorized representative who has indicated his/her understanding and acceptance.   Dental Advisory Given  Plan Discussed with: CRNA and Surgeon  Anesthesia Plan Comments:         Anesthesia Quick Evaluation

## 2015-03-15 NOTE — Transfer of Care (Signed)
Immediate Anesthesia Transfer of Care Note  Patient: Angela Ruiz  Procedure(s) Performed: Procedure(s): HYSTERECTOMY SUPRACERVICAL ABDOMINAL (N/A) BILATERAL SALPINGECTOMY (Bilateral)  Patient Location: PACU  Anesthesia Type:General  Level of Consciousness: awake, alert , oriented and patient cooperative  Airway & Oxygen Therapy: Patient Spontanous Breathing and Patient connected to nasal cannula oxygen  Post-op Assessment: Report given to RN, Post -op Vital signs reviewed and stable and Patient moving all extremities X 4  Post vital signs: Reviewed and stable  Last Vitals:  Filed Vitals:   03/15/15 1129  BP: 136/74  Pulse: 81  Temp: 37.1 C  Resp: 16    Complications: No apparent anesthesia complications

## 2015-03-15 NOTE — Progress Notes (Signed)
Patient declined Customer service manager

## 2015-03-15 NOTE — Op Note (Signed)
Angela Ruiz PROCEDURE DATE: 03/15/2015  PREOPERATIVE DIAGNOSIS:  Symptomatic fibroids, menorrhagia POSTOPERATIVE DIAGNOSIS:  Symptomatic fibroids, menorrhagia SURGEON:   Woodroe Mode, MD  ASSISTANT:RN assistant OPERATION:  Supracervical abdominal hysterectomy, Bilateral Salpingectomy Supracervical abdominal hysterectomy, Bilateral Salpingectomy ANESTHESIA:  General endotracheal.  INDICATIONS: The patient is a 44 y.o. [id number] with the aforementioned diagnoses who desires definitive surgical management. On the preoperative visit, the risks, benefits, indications, and alternatives of the procedure were reviewed with the patient.  On the day of surgery, the risks of surgery were again discussed with the patient including but not limited to: bleeding which may require transfusion or reoperation; infection which may require antibiotics; injury to bowel, bladder, ureters or other surrounding organs; need for additional procedures; thromboembolic phenomenon, incisional problems and other postoperative/anesthesia complications. Written informed consent was obtained.    OPERATIVE FINDINGS: A 14 week size uterus with normal tubes and ovaries bilaterally, dense adhesions of the bladder to the uterus  ESTIMATED BLOOD LOSS: 350 ml FLUIDS:  3000 ml of Lactated Ringers URINE OUTPUT:  150 ml of clear yellow urine. SPECIMENS:  Uterus,cervix,  bilateral fallopian tubes sent to pathology COMPLICATIONS:  None immediate.   DESCRIPTION OF PROCEDURE:  The patient received intravenous antibiotics and had sequential compression devices applied to her lower extremities while in the preoperative area.   She was taken to the operating room and placed under general anesthesia without difficulty.The abdomen and perineum were prepped and draped in a sterile manner, and she was placed in a dorsal supine position.  A Foley catheter was inserted into the bladder and attached to constant drainage. After an adequate  timeout was performed, a Pfannensteil skin incision was made. This incision was taken down to the fascia using electrocautery with care given to maintain good hemostasis. The fascia was incised in the midline and the fascial incision was then extended bilaterally using electrocautery without difficulty. The fascia was then dissected off the underlying rectus muscles using blunt and sharp dissection. Due to scarring from 3 previous cesarean sections the peritoneum was entered at this stage.The rectus muscles were split bluntly in the midline and the peritoneum entered sharply without complication. Omental adhesion was clamped cut and ligated.This peritoneal incision was then extended superiorly and inferiorly with care given to prevent bowel or bladder injury. Attention was then turned to the pelvis. A retractor was placed into the incision, and the bowel was packed away with moist laparotomy sponges. The uterus at this point was noted to be mobilized and was delivered up out of the abdomen.  The round ligaments on each side were clamped, suture ligated with 0 Vicryl, and transected with electrocautery allowing entry into the broad ligament. Of note, all sutures used in this procedure are 0 Vicryl unless otherwise noted. The anterior and posterior leaves of the broad ligament were separated, and the ureters were inspected to be safely away from the area of dissection bilaterally. The uteroovarian ligament and fallopian tube were clamped on the patient's right side, cut, and doubly suture ligated. This procedure was repeated in an identical fashion on the left site allowing for both adnexa to remain in place.  Kelly clamps were placed on the mesosalpinx of the right fallopian tube, and the fallopian tube was excised.  The pedicle was then secured with a free tie.  A similar process was carried out on the left side, allowing for bilateral salpingectomy.    .  A bladder flap was then created.  The bladder was then  bluntly dissected  off the lower uterine segment but due to dense adhesions this dissection was incomplete The uterine arteries were then skeletonized bilaterally and then clamped, cut, and suture ligated with care given to prevent ureteral injury.  The uterus was then amputated across the lower uterine segment leaving the cervix intact. The specimen was sent to pathology. The cervical canal was coagulated, and the anterior and posterior peritoneal edges were then reapproximated in the midline over the cervical stump without complication.    The pelvis was irrigated and hemostasis was reconfirmed at all pedicles and along the pelvic sidewall. Bleedingi n the ovarian fossa on the left was controlled with a running suture with 2-0 Vicryl The ureters were inspected and noted to be peristalsing bilaterally.  All laparotomy sponges and instruments were removed from the abdomen. The peritoneum was closed with a running stitch, and the fascia was also closed in a running fashion. The subcutaneous layer was reapproximated with 2-0 plain gut. The skin was closed with a 4-0 Vicryl subcuticular stitch. Sponge, lap, needle, and instrument counts were correct times two. The patient was taken to the recovery area awake, extubated and in stable condition.  Woodroe Mode, MD 03/15/2015 3:31 PM   Verita Schneiders, MD, [place] Attending [place], [facility]

## 2015-03-15 NOTE — H&P (Signed)
Angela Ruiz is an 44 y.o. female. [id number] Patient's last menstrual period was 03/09/2015. Symptomatic fibroid uterus with menorrhagia and dysmenorrhea scheduled for TAH today.   Pertinent Gynecological History: Menses: flow is excessive with use of   pads or tampons on heaviest days  Last pap: normal Date: 11/2014    Menstrual History:  Patient's last menstrual period was 03/09/2015.    Past Medical History  Diagnosis Date  . Hypertension   . Headache   . Anemia     Past Surgical History  Procedure Laterality Date  . Cesarean section      2007  3 CS  Family History  Problem Relation Age of Onset  . Alcohol abuse Neg Hx   . Arthritis Neg Hx   . Asthma Neg Hx   . Birth defects Neg Hx   . Cancer Neg Hx   . COPD Neg Hx   . Depression Neg Hx   . Diabetes Neg Hx   . Drug abuse Neg Hx   . Early death Neg Hx   . Hearing loss Neg Hx   . Heart disease Neg Hx   . Hyperlipidemia Neg Hx   . Kidney disease Neg Hx   . Learning disabilities Neg Hx   . Mental illness Neg Hx   . Mental retardation Neg Hx   . Miscarriages / Stillbirths Neg Hx   . Stroke Neg Hx   . Vision loss Neg Hx   . Varicose Veins Neg Hx   . Hypertension Mother   . Colitis Mother     Social History:  reports that she quit smoking about 11 years ago. She has never used smokeless tobacco. She reports that she does not drink alcohol or use illicit drugs.  Allergies:  Allergies  Allergen Reactions  . Eggs Or Egg-Derived Products Nausea And Vomiting    Prescriptions prior to admission  Medication Sig Dispense Refill Last Dose  . IRON PO Take 1 tablet by mouth daily.     . medroxyPROGESTERone (PROVERA) 10 MG tablet TAKE TWO TABLETS BY MOUTH ONCE DAILY 30 tablet 0   . ibuprofen (ADVIL,MOTRIN) 600 MG tablet Take 1 tablet (600 mg total) by mouth every 6 (six) hours as needed. (Patient not taking: Reported on 03/09/2015) 30 tablet 1 Not Taking at Unknown time  . ondansetron (ZOFRAN ODT) 8 MG  disintegrating tablet Take 1 tablet (8 mg total) by mouth every 8 (eight) hours as needed for nausea or vomiting. (Patient not taking: Reported on 12/09/2014) 20 tablet 0 Not Taking  . oxyCODONE-acetaminophen (PERCOCET/ROXICET) 5-325 MG per tablet Take 2 tablets by mouth every 4 (four) hours as needed for severe pain. (Patient not taking: Reported on 12/09/2014) 30 tablet 0 Not Taking    Review of Systems  Constitutional: Negative.   Respiratory: Negative.   Cardiovascular: Negative.   Genitourinary: Negative.     Blood pressure 136/74, pulse 81, temperature 98.8 F (37.1 C), temperature source Oral, resp. rate 16, last menstrual period 03/09/2015, SpO2 100 %. Physical Exam  Vitals reviewed. Constitutional: She is oriented to person, place, and time. She appears well-developed. No distress.  Cardiovascular: Normal rate.   Respiratory: Effort normal. No respiratory distress.  GI: Soft. There is no tenderness.  Transverse and vertical CS scars, NT, no mass  Neurological: She is alert and oriented to person, place, and time.  Skin: Skin is warm and dry.  Psychiatric: She has a normal mood and affect. Her behavior is normal.    No results found  for this or any previous visit (from the past 24 hour(s)).  Assessment/Plan: Symptomatic uterine fibroids.  Patient desires definitive management with hysterectomy.  I proposed doing a total abdominal hysterectomy (TAH) and prophylactic bilateral salpingectomy.  without  indication for oophorectomy. Prophylactic oophorectomy and associated surgical menopause could have major health risks such as loss of ovarian hormones that could lead to vasomotor symptoms and osteoporosis, increased risk of cardiovascular disease, dementia, procedure-related complications and increased overall mortality. However, patient was counseled regarding a prophylactic bilateral salpingectomy given that a growing body of knowledge reveals that the majority of cases of high grade  serous "ovarian" cancer actually are fallopian tube cancers.The precursor lesions are though to arise from the fimbriated end of the fallopian tube and the cancer can have metastases from this primary lesion; and removal of fallopian tubes do not result in any known hormonal imbalance.   Patient agrees with this proposed surgery.  The risks of surgery were discussed in detail with the patient including but not limited to: bleeding which may require transfusion or reoperation; infection which may require antibiotics; injury to bowel, bladder, ureters or other surrounding organs; need for additional procedures including laparotomy; thromboembolic phenomenon, incisional problems and other postoperative/anesthesia complications.  Patient was also advised that she will remain in house for 1-2 days; and expected recovery time after a hysterectomy is 6-8 weeks.  Likelihood of success in alleviating the patient's symptoms was discussed. Routine postoperative instructions will be reviewed with the patient and her family in detail after surgery.      ARNOLD,JAMES 03/15/2015, 12:17 PM

## 2015-03-15 NOTE — Anesthesia Postprocedure Evaluation (Signed)
  Anesthesia Post-op Note  Patient: Angela Ruiz  Procedure(s) Performed: Procedure(s) (LRB): HYSTERECTOMY SUPRACERVICAL ABDOMINAL (N/A) BILATERAL SALPINGECTOMY (Bilateral)  Patient Location: PACU  Anesthesia Type: General  Level of Consciousness: awake and alert   Airway and Oxygen Therapy: Patient Spontanous Breathing  Post-op Pain: mild  Post-op Assessment: Post-op Vital signs reviewed, Patient's Cardiovascular Status Stable, Respiratory Function Stable, Patent Airway and No signs of Nausea or vomiting  Last Vitals:  Filed Vitals:   03/15/15 1700  BP: 108/77  Pulse: 75  Temp:   Resp: 15    Post-op Vital Signs: stable   Complications: No apparent anesthesia complications

## 2015-03-16 ENCOUNTER — Encounter (HOSPITAL_COMMUNITY): Payer: Self-pay | Admitting: Obstetrics & Gynecology

## 2015-03-16 LAB — CBC
HCT: 31.2 % — ABNORMAL LOW (ref 36.0–46.0)
HEMOGLOBIN: 10.3 g/dL — AB (ref 12.0–15.0)
MCH: 28.6 pg (ref 26.0–34.0)
MCHC: 33 g/dL (ref 30.0–36.0)
MCV: 86.7 fL (ref 78.0–100.0)
PLATELETS: 249 10*3/uL (ref 150–400)
RBC: 3.6 MIL/uL — ABNORMAL LOW (ref 3.87–5.11)
RDW: 14.2 % (ref 11.5–15.5)
WBC: 10.5 10*3/uL (ref 4.0–10.5)

## 2015-03-16 MED ORDER — DIPHENHYDRAMINE HCL 50 MG/ML IJ SOLN
INTRAMUSCULAR | Status: AC
  Administered 2015-03-16: 50 mg
  Filled 2015-03-16: qty 1

## 2015-03-16 MED ORDER — DIPHENHYDRAMINE HCL 50 MG/ML IJ SOLN
25.0000 mg | Freq: Four times a day (QID) | INTRAMUSCULAR | Status: DC | PRN
  Administered 2015-03-16 (×2): 25 mg via INTRAVENOUS
  Filled 2015-03-16 (×2): qty 1

## 2015-03-16 NOTE — Progress Notes (Signed)
I stopped to check on patient's need I ordered her meals, by Viria Alvarez Spanish Interpreter °

## 2015-03-16 NOTE — Progress Notes (Signed)
1 Day Post-Op Procedure(s) (LRB): HYSTERECTOMY SUPRACERVICAL ABDOMINAL (N/A) BILATERAL SALPINGECTOMY (Bilateral)  Subjective: Patient reports incisional pain and tolerating PO.  Itching  Objective: I have reviewed patient's vital signs, intake and output, medications and labs. Blood pressure 106/77, pulse 99, temperature 97.7 F (36.5 C), temperature source Oral, resp. rate 13, height 4\' 11"  (1.499 m), weight 73.936 kg (163 lb), last menstrual period 03/09/2015, SpO2 97 %.  General: alert, cooperative and scratching GI: alert, cooperative and scratching Extremities: extremities normal, atraumatic, no cyanosis or edema CBC    Component Value Date/Time   WBC 10.5 03/16/2015 0605   RBC 3.60* 03/16/2015 0605   RBC 4.69 06/13/2013 0946   HGB 10.3* 03/16/2015 0605   HCT 31.2* 03/16/2015 0605   PLT 249 03/16/2015 0605   MCV 86.7 03/16/2015 0605   MCH 28.6 03/16/2015 0605   MCHC 33.0 03/16/2015 0605   RDW 14.2 03/16/2015 0605   LYMPHSABS 2.8 07/14/2013 0939   MONOABS 0.4 07/14/2013 0939   EOSABS 0.0 07/14/2013 0939   BASOSABS 0.0 07/14/2013 0939     Assessment: s/p Procedure(s): HYSTERECTOMY SUPRACERVICAL ABDOMINAL (N/A) BILATERAL SALPINGECTOMY (Bilateral): stable  Plan: Encourage ambulation Advance to PO medication  Benadryl for itch  LOS: 1 day    Shalaine Payson 03/16/2015, 9:15 AM

## 2015-03-16 NOTE — Addendum Note (Signed)
Addendum  created 03/16/15 0746 by Brock Ra, CRNA   Modules edited: Notes Section   Notes Section:  File: [id number]

## 2015-03-16 NOTE — Addendum Note (Signed)
Addendum  created 03/16/15 1451 by Laverle Hobby, CRNA   Modules edited: Charges VN

## 2015-03-16 NOTE — Anesthesia Postprocedure Evaluation (Signed)
  Anesthesia Post-op Note  Patient: Angela Ruiz  Procedure(s) Performed: Procedure(s): HYSTERECTOMY SUPRACERVICAL ABDOMINAL (N/A) BILATERAL SALPINGECTOMY (Bilateral)  Patient Location: [facility] Unit  Anesthesia Type:General  Level of Consciousness: awake, alert , oriented and patient cooperative  Airway and Oxygen Therapy: Patient Spontanous Breathing and Patient connected to nasal cannula oxygen  Post-op Pain: mild  Post-op Assessment: Post-op Vital signs reviewed, Patient's Cardiovascular Status Stable, Respiratory Function Stable, Patent Airway, No signs of Nausea or vomiting, Adequate PO intake, Pain level controlled, No headache and No backache              Post-op Vital Signs: Reviewed and stable  Last Vitals:  Filed Vitals:   03/16/15 0548  BP: 106/77  Pulse:   Temp: 36.5 C  Resp:     Complications: No apparent anesthesia complications

## 2015-03-17 MED ORDER — IBUPROFEN 600 MG PO TABS: 600.0000 mg | ORAL_TABLET | Freq: Four times a day (QID) | ORAL | Status: DC | PRN

## 2015-03-17 MED ORDER — OXYCODONE-ACETAMINOPHEN 5-325 MG PO TABS: 1.0000 | ORAL_TABLET | ORAL | Status: DC | PRN

## 2015-03-17 NOTE — Discharge Summary (Signed)
Physician Discharge Summary  Patient ID: Angela Ruiz MRN: [id number] DOB/AGE: [birth year] 44 y.o.  Admit date: 03/15/2015 Discharge date: 03/17/2015  Admission Diagnoses:Fibroid uterus and menorrhagia  Discharge Diagnoses: same Active Problems:   * No active hospital problems. *   Discharged Condition: good  Hospital Course:  Angela Ruiz is an 44 y.o. female. [id number] Patient's last menstrual period was 03/09/2015. Symptomatic fibroid uterus with menorrhagia and dysmenorrhea scheduled for TAH today.   Pertinent Gynecological History: Menses: flow is excessive with use of   pads or tampons on heaviest days  Last pap: normal Date: 11/2014   Menstrual History:  Patient's last menstrual period was 03/09/2015.    Past Medical History  Diagnosis Date  . Hypertension   . Headache   . Anemia     Past Surgical History  Procedure Laterality Date  . Cesarean section      2007  3 CS  Family History  Problem Relation Age of Onset  . Alcohol abuse Neg Hx   . Arthritis Neg Hx   . Asthma Neg Hx   . Birth defects Neg Hx   . Cancer Neg Hx   . COPD Neg Hx   . Depression Neg Hx   . Diabetes Neg Hx   . Drug abuse Neg Hx   . Early death Neg Hx   . Hearing loss Neg Hx   . Heart disease Neg Hx   . Hyperlipidemia Neg Hx   . Kidney disease Neg Hx   . Learning disabilities Neg Hx   . Mental illness Neg Hx   . Mental retardation Neg Hx   . Miscarriages / Stillbirths Neg Hx   . Stroke Neg Hx   . Vision loss Neg Hx   . Varicose Veins Neg Hx   . Hypertension Mother   . Colitis Mother     Social History:  reports that she quit smoking about 11 years ago. She has never used smokeless tobacco. She reports that she does not drink alcohol or use illicit drugs.  Allergies:  Allergies  Allergen Reactions  . Eggs Or  Egg-Derived Products Nausea And Vomiting    Prescriptions prior to admission  Medication Sig Dispense Refill Last Dose  . IRON PO Take 1 tablet by mouth daily.     . medroxyPROGESTERone (PROVERA) 10 MG tablet TAKE TWO TABLETS BY MOUTH ONCE DAILY 30 tablet 0   . ibuprofen (ADVIL,MOTRIN) 600 MG tablet Take 1 tablet (600 mg total) by mouth every 6 (six) hours as needed. (Patient not taking: Reported on 03/09/2015) 30 tablet 1 Not Taking at Unknown time  . ondansetron (ZOFRAN ODT) 8 MG disintegrating tablet Take 1 tablet (8 mg total) by mouth every 8 (eight) hours as needed for nausea or vomiting. (Patient not taking: Reported on 12/09/2014) 20 tablet 0 Not Taking  . oxyCODONE-acetaminophen (PERCOCET/ROXICET) 5-325 MG per tablet Take 2 tablets by mouth every 4 (four) hours as needed for severe pain. (Patient not taking: Reported on 12/09/2014) 30 tablet 0 Not Taking    Review of Systems  Constitutional: Negative.  Respiratory: Negative.  Cardiovascular: Negative.  Genitourinary: Negative.    Blood pressure 136/74, pulse 81, temperature 98.8 F (37.1 C), temperature source Oral, resp. rate 16, last menstrual period 03/09/2015, SpO2 100 %. Physical Exam  Vitals reviewed. Constitutional: She is oriented to person, place, and time. She appears well-developed. No distress.  Cardiovascular: Normal rate.  Respiratory: Effort normal. No respiratory distress.  GI: Soft. There is no tenderness.  Transverse and  vertical CS scars, NT, no mass  Neurological: She is alert and oriented to person, place, and time.  Skin: Skin is warm and dry.  Psychiatric: She has a normal mood and affect. Her behavior is normal.     Lab Results Last 24 Hours    No results found for this or any previous visit (from the past 24 hour(s)).    Assessment/Plan: Symptomatic uterine fibroids.  Patient desires definitive management with hysterectomy. I proposed doing a total  abdominal hysterectomy (TAH) and prophylactic bilateral salpingectomy. without indication for oophorectomy. Prophylactic oophorectomy and associated surgical menopause could have major health risks such as loss of ovarian hormones that could lead to vasomotor symptoms and osteoporosis, increased risk of cardiovascular disease, dementia, procedure-related complications and increased overall mortality. However, patient was counseled regarding a prophylactic bilateral salpingectomy given that a growing body of knowledge reveals that the majority of cases of high grade serous "ovarian" cancer actually are fallopian tube cancers.The precursor lesions are though to arise from the fimbriated end of the fallopian tube and the cancer can have metastases from this primary lesion; and removal of fallopian tubes do not result in any known hormonal imbalance. Patient agrees with this proposed surgery. The risks of surgery were discussed in detail with the patient including but not limited to: bleeding which may require transfusion or reoperation; infection which may require antibiotics; injury to bowel, bladder, ureters or other surrounding organs; need for additional procedures including laparotomy; thromboembolic phenomenon, incisional problems and other postoperative/anesthesia complications. Patient was also advised that she will remain in house for 1-2 days; and expected recovery time after a hysterectomy is 6-8 weeks. Likelihood of success in alleviating the patient's symptoms was discussed. Routine postoperative instructions will be reviewed with the patient and her family in detail after surgery.     Ratasha Fabre 03/15/2015, 12:17 PM       Consults: None  Significant Diagnostic Studies: labs:  CBC    Component Value Date/Time   WBC 10.5 03/16/2015 0605   RBC 3.60* 03/16/2015 0605   RBC 4.69 06/13/2013 0946   HGB 10.3* 03/16/2015 0605   HCT 31.2* 03/16/2015 0605   PLT 249 03/16/2015 0605   MCV  86.7 03/16/2015 0605   MCH 28.6 03/16/2015 0605   MCHC 33.0 03/16/2015 0605   RDW 14.2 03/16/2015 0605   LYMPHSABS 2.8 07/14/2013 0939   MONOABS 0.4 07/14/2013 0939   EOSABS 0.0 07/14/2013 0939   BASOSABS 0.0 07/14/2013 0939      Treatments: surgery: supracervical abdominal hysterectomy and bilateral salpingectomy  Discharge Exam: Blood pressure 101/59, pulse 85, temperature 98.3 F (36.8 C), temperature source Oral, resp. rate 16, height 4\' 11"  (1.499 m), weight 73.936 kg (163 lb), last menstrual period 03/09/2015, SpO2 98 %. General appearance: alert, cooperative and no distress GI: dressing, incision dry intact mild tenderness, slight bruise Extremities: extremities normal, atraumatic, no cyanosis or edema  Disposition: 01-Home or Self Care     Medication List    STOP taking these medications        IRON PO     medroxyPROGESTERone 10 MG tablet  Commonly known as:  PROVERA     ondansetron 8 MG disintegrating tablet  Commonly known as:  ZOFRAN ODT      TAKE these medications        ibuprofen 600 MG tablet  Commonly known as:  ADVIL,MOTRIN  Take 1 tablet (600 mg total) by mouth every 6 (six) hours as needed.     oxyCODONE-acetaminophen 5-325 MG  per tablet  Commonly known as:  PERCOCET/ROXICET  Take 1-2 tablets by mouth every 4 (four) hours as needed for severe pain (moderate to severe pain (when tolerating fluids)).           Follow-up Information    Follow up with [facility] In 4 weeks.   Specialty:  Obstetrics and Gynecology   Contact information:   [address] [place] [phone]      Signed: Emeterio Reeve 03/17/2015, 7:40 AM

## 2015-03-17 NOTE — Discharge Instructions (Signed)
Histerectoma abdominal, cuidados posteriores (Abdominal Hysterectomy, Care After) Siga estas instrucciones durante las prximas semanas. Estas indicaciones le proporcionan informacin general acerca de cmo deber cuidarse despus del procedimiento. El mdico tambin podr darle instrucciones ms especficas. El tratamiento se ha planificado de acuerdo a las prcticas mdicas actuales, pero a veces se producen problemas. Comunquese con el mdico si tiene algn problema o tiene dudas despus del procedimiento.  QU ESPERAR DESPUS DEL PROCEDIMIENTO Despus del procedimiento, es normal tener los siguientes sntomas:  Dolor.  Cansancio.  Prdida del apetito.  Menos inters sexual. INSTRUCCIONES PARA EL CUIDADO EN EL HOGAR  La recuperacin de esta ciruga demora de 4a 6semanas. Asegrese de seguir todas las indicaciones del mdico. Las instrucciones para el cuidado en el hogar pueden incluir:  Tome los medicamentos para calmar el dolor solamente como se lo indic el mdico. No tome medicamentos de venta libre para calmar el dolor sin consultar antes al mdico.  Cambie el vendaje como se lo indic el mdico.  Debe ver nuevamente al mdico para que le saque las suturas.  Tome duchas en lugar de baos durante 2 a 3 semanas. Pregntele al mdico cundo es seguro comenzar a tomar duchas.  No se haga duchas vaginales, no use tampones ni tenga relaciones sexuales durante al menos 6semanas o hasta que el mdico la autorice.  Siga las indicaciones del mdico con respecto a la actividad fsica, a levantar objetos, a conducir el automvil y a las actividades en general.  Descanse y duerma lo suficiente.  No levante nada que sea ms pesado que un galn de leche (alrededor de 10lb [4.5kg]) durante el primer mes posterior a la ciruga.  Puede retomar su dieta normal si el mdico la autoriza.  No beba alcohol hasta que el mdico se lo autorice.  Si tiene estreimiento, pregntele al mdico si  puede tomar un laxante suave.  Los alimentos con alto contenido de fibra tambin pueden tener un efecto laxante. Coma muchas frutas y vegetales crudos, cereales integrales y frijoles.  Beba suficiente lquido para mantener la orina clara o de color amarillo plido.  Trate de que haya alguna persona en su casa para ayudarla con las tareas del hogar durante 1 a 2 semanas despus de la ciruga.  Cumpla con todas las visitas de control. SOLICITE ATENCIN MDICA SI:   Siente escalofros o tiene fiebre.  Tiene sntomas de hinchazn, enrojecimiento o dolor en el rea de la incisin que estn empeorando.  Tiene pus en el lugar de la incisin.  Advierte un olor ftido que proviene de la incisin o del vendaje.  La herida se abre.  Se siente mareada o sufre un desmayo.  Siente dolor o tiene una hemorragia al orinar.  Tiene diarrea persistente.  Tiene nuseas o vmitos persistentes.  Tiene flujo vaginal anormal.  Tiene una erupcin cutnea.  Tiene alguna reaccin anormal o aparece una alergia por los medicamentos.  El medicamento no le calma el dolor. SOLICITE ATENCIN MDICA DE INMEDIATO SI:   Tiene fiebre y los sntomas empeoran repentinamente.  Siente un dolor abdominal intenso.  Siente dolor en el pecho.  Le falta el aire.  Se desmaya.  Siente dolor, u observa hinchazn o enrojecimiento en la pierna.  Tiene una hemorragia vaginal abundante, con cogulos. ASEGRESE DE QUE:  Comprende estas instrucciones.  Controlar su afeccin.  Recibir ayuda de inmediato si no mejora o si empeora. Document Released: 12/31/2006 Document Revised: 06/24/2013 ExitCare Patient Information 2015 [employer]. This information is not intended to replace   advice given to you by your health care provider. Make sure you discuss any questions you have with your health care provider.  

## 2015-03-17 NOTE — Progress Notes (Signed)
Pt. Is discharged in the care of daugther,with N.T. Escort. Denies pain or discomfort. Spirits are good.  States she understands all discharged instructions well. No equipment needed  For home use. Pt. Discharged. per ambulatory.Pt's daughter was also given instructions on home care. Understands well.Marland Kitchen

## 2015-04-15 ENCOUNTER — Ambulatory Visit (INDEPENDENT_AMBULATORY_CARE_PROVIDER_SITE_OTHER): Payer: Self-pay | Admitting: Obstetrics & Gynecology

## 2015-04-15 VITALS — BP 139/94 | HR 72 | Temp 98.0°F | Wt 161.9 lb

## 2015-04-15 DIAGNOSIS — Z90711 Acquired absence of uterus with remaining cervical stump: Secondary | ICD-10-CM | POA: Insufficient documentation

## 2015-04-15 DIAGNOSIS — Z1239 Encounter for other screening for malignant neoplasm of breast: Secondary | ICD-10-CM

## 2015-04-15 MED ORDER — IBUPROFEN 600 MG PO TABS
600.0000 mg | ORAL_TABLET | Freq: Four times a day (QID) | ORAL | Status: DC | PRN
Start: 2015-04-15 — End: 2018-02-12

## 2015-04-15 MED ORDER — DOCUSATE SODIUM 100 MG PO CAPS
100.0000 mg | ORAL_CAPSULE | Freq: Two times a day (BID) | ORAL | Status: DC | PRN
Start: 2015-04-15 — End: 2018-02-12

## 2015-04-15 NOTE — Patient Instructions (Signed)
Estreimiento - Adultos (Constipation, Adult) Estreimiento significa que una persona tiene menos de tres evacuaciones en una semana, dificultad para defecar, o que las heces son secas, duras, o ms grandes que lo normal. A medida que envejecemos el estreimiento es ms comn. Una dieta baja en fibra, no tomar suficientes lquidos y el uso de ciertos medicamentos pueden empeorar el estreimiento.  CAUSAS   Ciertos medicamentos, como los antidepresivos, analgsicos, suplementos de hierro, anticidos y diurticos.  Algunas enfermedades, como la diabetes, el sndrome del colon irritable, enfermedad de la tiroides, o depresin.  No beber suficiente agua.  No consumir suficientes alimentos ricos en fibra.  Situaciones de estrs o viajes.  Falta de actividad fsica o de ejercicio.  Ignorar la necesidad sbita de defecar.  Uso en exceso de laxantes. SIGNOS Y SNTOMAS   Defecar menos de tres veces por semana.  Dificultad para defecar.  Tener las heces secas y duras, o ms grandes que las normales.  Sensacin de estar lleno o hinchado.  Dolor en la parte baja del abdomen.  No sentir alivio despus de defecar. DIAGNSTICO  El mdico le har una historia clnica y un examen fsico. Pueden hacerle exmenes adicionales para el estreimiento grave. Estos estudios pueden ser:  Un radiografa con enema de bario para examinar el recto, el colon y, en algunos casos, el intestino delgado.  Una sigmoidoscopia para examinar el colon inferior.  Una colonoscopia para examinar todo el colon. TRATAMIENTO  El tratamiento depender de la gravedad del estreimiento y de la causa. Algunos tratamientos nutricionales son beber ms lquidos y comer ms alimentos ricos en fibra. El cambio en el estilo de vida incluye hacer ejercicios de manera regular. Si estas recomendaciones para realizar cambios en la dieta y en el estilo de vida no ayudan, el mdico le puede indicar el uso de laxantes de venta libre  para ayudarlo a defecar. Los medicamentos recetados se pueden prescribir si los medicamentos de venta libre no lo ayudan.  INSTRUCCIONES PARA EL CUIDADO EN EL HOGAR   Consuma alimentos con alto contenido de fibra, como frutas, vegetales, cereales integrales y porotos.  Limite los alimentos procesados ricos en grasas y azcar, como las papas fritas, hamburguesas, galletas, dulces y refrescos.  Puede agregar un suplemento de fibra a su dieta si no obtiene lo suficiente de los alimentos.  Beba suficiente lquido para mantener la orina clara o de color amarillo plido.  Haga ejercicio regularmente o segn las indicaciones del mdico.  Vaya al bao cuando sienta la necesidad de ir. No se aguante las ganas.  Tome solo medicamentos de venta libre o recetados, segn las indicaciones del mdico. No tome otros medicamentos para el estreimiento sin consultarlo antes con su mdico. SOLICITE ATENCIN MDICA DE INMEDIATO SI:   Observa sangre brillante en las heces.  El estreimiento dura ms de 4 das o empeora.  Siente dolor abdominal o rectal.  Las heces son delgadas como un lpiz.  Pierde peso de manera inexplicable. ASEGRESE DE QUE:   Comprende estas instrucciones.  Controlar su afeccin.  Recibir ayuda de inmediato si no mejora o si empeora.   Esta informacin no tiene como fin reemplazar el consejo del mdico. Asegrese de hacerle al mdico cualquier pregunta que tenga.   Document Released: 07/09/2007 Document Revised: 07/10/2014 Elsevier Interactive Patient Education 2016 Elsevier Inc.  

## 2015-04-15 NOTE — Progress Notes (Signed)
Subjective: mild RLQ pain     Angela Ruiz is a 44 y.o. female who presents to the clinic 1 month status post [facility] BS for fibroids. Eating a regular diet without difficulty. Bowel movements are abnormal with straining. Pain is controlled without any medications.  The following portions of the patient's history were reviewed and updated as appropriate: allergies, current medications, past family history, past medical history, past social history, past surgical history and problem list.  Review of Systems Pertinent items are noted in HPI.    Objective:    BP 139/94 mmHg  Pulse 72  Temp(Src) 98 F (36.7 C)  Wt 161 lb 14.4 oz (73.437 kg) General:  alert, cooperative and no distress  Abdomen: soft, non-tender  Incision:   healing well, no drainage, no erythema, no hernia, no seroma, no swelling, no dehiscence, incision well approximated     Assessment:    Postoperative course complicated by mild pain and constipation Operative findings again reviewed. Pathology report discussed.    Plan:    1. Continue any current medications. 2. Wound care discussed. 3. Activity restrictions: none 4. Anticipated return to work: 1-2 weeks. 5. Follow up:prn. Ibuprofen 600 mg prn, add colace, consider metamucil Mammogram ordered Woodroe Mode, MD   04/15/2015

## 2015-05-31 ENCOUNTER — Other Ambulatory Visit (HOSPITAL_COMMUNITY): Payer: Self-pay | Admitting: *Deleted

## 2015-05-31 DIAGNOSIS — N644 Mastodynia: Secondary | ICD-10-CM

## 2015-06-04 ENCOUNTER — Ambulatory Visit (HOSPITAL_COMMUNITY): Payer: Self-pay

## 2015-06-07 ENCOUNTER — Other Ambulatory Visit: Payer: Self-pay

## 2017-08-17 ENCOUNTER — Other Ambulatory Visit (HOSPITAL_COMMUNITY): Payer: Self-pay | Admitting: *Deleted

## 2017-08-17 DIAGNOSIS — N644 Mastodynia: Secondary | ICD-10-CM

## 2017-09-06 ENCOUNTER — Ambulatory Visit
Admission: RE | Admit: 2017-09-06 | Discharge: 2017-09-06 | Disposition: A | Discharge status: Home / Self Care | Payer: No Typology Code available for payment source | Source: Ambulatory Visit | Attending: Obstetrics and Gynecology | Admitting: Obstetrics and Gynecology

## 2017-09-06 ENCOUNTER — Ambulatory Visit (HOSPITAL_COMMUNITY)
Admission: RE | Admit: 2017-09-06 | Discharge: 2017-09-06 | Disposition: A | Discharge status: Home / Self Care | Payer: Self-pay | Source: Ambulatory Visit | Attending: Obstetrics and Gynecology | Admitting: Obstetrics and Gynecology

## 2017-09-06 ENCOUNTER — Encounter (HOSPITAL_COMMUNITY): Payer: Self-pay

## 2017-09-06 ENCOUNTER — Other Ambulatory Visit (HOSPITAL_COMMUNITY): Payer: Self-pay | Admitting: Obstetrics and Gynecology

## 2017-09-06 VITALS — BP 118/80 | Ht 59.0 in

## 2017-09-06 DIAGNOSIS — N6452 Nipple discharge: Secondary | ICD-10-CM

## 2017-09-06 DIAGNOSIS — N644 Mastodynia: Secondary | ICD-10-CM

## 2017-09-06 DIAGNOSIS — Z1239 Encounter for other screening for malignant neoplasm of breast: Secondary | ICD-10-CM

## 2017-09-06 DIAGNOSIS — N6320 Unspecified lump in the left breast, unspecified quadrant: Secondary | ICD-10-CM

## 2017-09-06 DIAGNOSIS — N6321 Unspecified lump in the left breast, upper outer quadrant: Secondary | ICD-10-CM

## 2017-09-06 NOTE — Progress Notes (Signed)
Complaints of left breast pain x 3 months and spontaneous bilateral white nipple discharge x 5 months. Patient states the pain comes and goes. Patient rates the pain at a 4-5 out of 10.  Pap Smear: Pap smear not completed today. Last Pap smear was 11/11/2014 at [facility] and normal. Per patient has no history of an abnormal Pap smear. Patient has a history of a supracervical hysterectomy 03/15/2015 due to fibroids. Last Pap smear and supracervical hysterectomy results are in Epic.  Physical exam: Breasts Breasts symmetrical. No skin abnormalities bilateral breasts. Slight bilateral nipple inversion that per patient is normal for her. No nipple discharge left breast. Expressed a small amount of clear nipple discharge from right breast on exam. Unable to express enough discharge to send a sample of discharge to cytology. No lymphadenopathy. No lumps palpated right breast. Palpated a lump within the left breast at 1 o'clock 13 cm from the nipple. Complaints of tenderness when palpated left outer breast on exam. Referred patient to the [facility] for a diagnostic mammogram and possible bilateral breast ultrasounds. Appointment scheduled for Thursday, September 06, 2017 at 0930.        Pelvic/Bimanual No Pap smear completed today since last Pap smear was 11/11/2014. Pap smear not indicated per BCCCP guidelines.   Smoking History: Patient is a former smoker that quit 14 years ago.   Patient Navigation: Patient education provided. Access to services provided for patient through [facility] program. Spanish interpreter provided.   Breast and Cervical Cancer Risk Assessment: Patient has no family history of breast cancer, known genetic mutations, or radiation treatment to the chest before age 40. Patient has no history of cervical dysplasia, immunocompromised, or DES exposure in-utero.  Used Spanish interpreter [employer] from [place].

## 2017-09-06 NOTE — Patient Instructions (Signed)
Explained breast self awareness with [name]. Patient did not need a Pap smear today due to last Pap smear was 11/11/2014. Let her know BCCCP will cover Pap smears every 3 years unless has a history of abnormal Pap smears. Referred patient to the [facility] for a diagnostic mammogram and possible bilateral breast ultrasounds. Appointment scheduled for Thursday, September 06, 2017 at 0930. Angela Ruiz verbalized understanding.  [name], Arvil Chaco, RN 11:38 AM

## 2017-09-07 ENCOUNTER — Encounter (HOSPITAL_COMMUNITY): Payer: Self-pay | Admitting: *Deleted

## 2017-09-11 ENCOUNTER — Ambulatory Visit
Admission: RE | Admit: 2017-09-11 | Discharge: 2017-09-11 | Disposition: A | Discharge status: Home / Self Care | Payer: No Typology Code available for payment source | Source: Ambulatory Visit | Attending: Obstetrics and Gynecology | Admitting: Obstetrics and Gynecology

## 2017-09-11 ENCOUNTER — Other Ambulatory Visit (HOSPITAL_COMMUNITY): Payer: Self-pay | Admitting: Obstetrics and Gynecology

## 2017-09-11 DIAGNOSIS — N6320 Unspecified lump in the left breast, unspecified quadrant: Secondary | ICD-10-CM

## 2018-02-12 ENCOUNTER — Encounter (HOSPITAL_COMMUNITY): Payer: Self-pay | Admitting: Emergency Medicine

## 2018-02-12 ENCOUNTER — Emergency Department (HOSPITAL_COMMUNITY): Payer: No Typology Code available for payment source

## 2018-02-12 ENCOUNTER — Emergency Department (HOSPITAL_COMMUNITY)
Admission: EM | Admit: 2018-02-12 | Discharge: 2018-02-12 | Disposition: A | Discharge status: Home / Self Care | Payer: No Typology Code available for payment source | Attending: Emergency Medicine | Admitting: Emergency Medicine

## 2018-02-12 DIAGNOSIS — Z87891 Personal history of nicotine dependence: Secondary | ICD-10-CM | POA: Diagnosis not present

## 2018-02-12 DIAGNOSIS — Y9389 Activity, other specified: Secondary | ICD-10-CM | POA: Insufficient documentation

## 2018-02-12 DIAGNOSIS — Y92410 Unspecified street and highway as the place of occurrence of the external cause: Secondary | ICD-10-CM | POA: Insufficient documentation

## 2018-02-12 DIAGNOSIS — R109 Unspecified abdominal pain: Secondary | ICD-10-CM | POA: Insufficient documentation

## 2018-02-12 DIAGNOSIS — Y999 Unspecified external cause status: Secondary | ICD-10-CM | POA: Diagnosis not present

## 2018-02-12 DIAGNOSIS — R51 Headache: Secondary | ICD-10-CM | POA: Diagnosis not present

## 2018-02-12 DIAGNOSIS — I10 Essential (primary) hypertension: Secondary | ICD-10-CM | POA: Diagnosis not present

## 2018-02-12 DIAGNOSIS — S199XXA Unspecified injury of neck, initial encounter: Secondary | ICD-10-CM | POA: Diagnosis present

## 2018-02-12 DIAGNOSIS — S161XXA Strain of muscle, fascia and tendon at neck level, initial encounter: Secondary | ICD-10-CM

## 2018-02-12 LAB — I-STAT CHEM 8, ED
BUN: 9 mg/dL (ref 6–20)
CHLORIDE: 105 mmol/L (ref 98–111)
CREATININE: 0.5 mg/dL (ref 0.44–1.00)
Calcium, Ion: 1.13 mmol/L — ABNORMAL LOW (ref 1.15–1.40)
GLUCOSE: 105 mg/dL — AB (ref 70–99)
HEMATOCRIT: 42 % (ref 36.0–46.0)
HEMOGLOBIN: 14.3 g/dL (ref 12.0–15.0)
POTASSIUM: 3.5 mmol/L (ref 3.5–5.1)
Sodium: 140 mmol/L (ref 135–145)
TCO2: 24 mmol/L (ref 22–32)

## 2018-02-12 LAB — COMPREHENSIVE METABOLIC PANEL
ALBUMIN: 4.2 g/dL (ref 3.5–5.0)
ALK PHOS: 63 U/L (ref 38–126)
ALT: 19 U/L (ref 0–44)
ANION GAP: 11 (ref 5–15)
AST: 21 U/L (ref 15–41)
BUN: 9 mg/dL (ref 6–20)
CALCIUM: 9 mg/dL (ref 8.9–10.3)
CO2: 24 mmol/L (ref 22–32)
Chloride: 103 mmol/L (ref 98–111)
Creatinine, Ser: 0.54 mg/dL (ref 0.44–1.00)
GFR calc non Af Amer: >60 mL/min (ref >60)
GLUCOSE: 103 mg/dL — AB (ref 70–99)
POTASSIUM: 3.4 mmol/L — AB (ref 3.5–5.1)
SODIUM: 138 mmol/L (ref 135–145)
Total Bilirubin: 0.6 mg/dL (ref 0.3–1.2)
Total Protein: 7.6 g/dL (ref 6.5–8.1)

## 2018-02-12 LAB — CBC
HCT: 41.5 % (ref 36.0–46.0)
Hemoglobin: 13.7 g/dL (ref 12.0–15.0)
MCH: 28.9 pg (ref 26.0–34.0)
MCHC: 33 g/dL (ref 30.0–36.0)
MCV: 87.6 fL (ref 78.0–100.0)
PLATELETS: 301 10*3/uL (ref 150–400)
RBC: 4.74 MIL/uL (ref 3.87–5.11)
RDW: 12.4 % (ref 11.5–15.5)
WBC: 7.6 10*3/uL (ref 4.0–10.5)

## 2018-02-12 MED ORDER — MORPHINE SULFATE (PF) 4 MG/ML IV SOLN
6.0000 mg | Freq: Once | INTRAVENOUS | Status: AC
Start: 2018-02-12 — End: 2018-02-12
  Administered 2018-02-12: 6 mg via INTRAVENOUS
  Filled 2018-02-12: qty 2

## 2018-02-12 MED ORDER — IOPAMIDOL (ISOVUE-370) INJECTION 76%
INTRAVENOUS | Status: AC
  Filled 2018-02-12: qty 100

## 2018-02-12 MED ORDER — HYDROCODONE-ACETAMINOPHEN 5-325 MG PO TABS
2.0000 | ORAL_TABLET | Freq: Once | ORAL | Status: AC
  Administered 2018-02-12: 2 via ORAL
  Filled 2018-02-12: qty 2

## 2018-02-12 MED ORDER — SODIUM CHLORIDE 0.9 % IV BOLUS
500.0000 mL | Freq: Once | INTRAVENOUS | Status: AC
  Administered 2018-02-12: 500 mL via INTRAVENOUS

## 2018-02-12 MED ORDER — IBUPROFEN 200 MG PO TABS
600.0000 mg | ORAL_TABLET | Freq: Once | ORAL | Status: AC
  Administered 2018-02-12: 600 mg via ORAL
  Filled 2018-02-12: qty 1

## 2018-02-12 MED ORDER — IOPAMIDOL (ISOVUE-370) INJECTION 76%
100.0000 mL | Freq: Once | INTRAVENOUS | Status: AC | PRN
  Administered 2018-02-12: 100 mL via INTRAVENOUS
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
## 2018-02-12 NOTE — Discharge Instructions (Signed)
Use ice, Tylenol and Motrin as needed for pain  If you were given medicines take as directed.  If you are on coumadin or contraceptives realize their levels and effectiveness is altered by many different medicines.  If you have any reaction (rash, tongues swelling, other) to the medicines stop taking and see a physician.    If your blood pressure was elevated in the ER make sure you follow up for management with a primary doctor or return for chest pain, shortness of breath or stroke symptoms.  Please follow up as directed and return to the ER or see a physician for new or worsening symptoms.  Thank you. Vitals:   02/12/18 1515 02/12/18 1615 02/12/18 1730 02/12/18 1830  BP: (!) 147/94 129/80 (!) 139/96 118/75  Pulse: 88 81 78   Resp: 18     Temp: 97.7 F (36.5 C)     TempSrc: Oral     SpO2: 98% 99% 99%
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
## 2018-11-18 ENCOUNTER — Ambulatory Visit: Payer: Self-pay | Admitting: Obstetrics & Gynecology
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
## 2019-09-09 ENCOUNTER — Emergency Department (HOSPITAL_COMMUNITY): Payer: Self-pay

## 2019-09-09 ENCOUNTER — Encounter (HOSPITAL_COMMUNITY): Payer: Self-pay

## 2019-09-09 ENCOUNTER — Emergency Department (HOSPITAL_COMMUNITY)
Admission: EM | Admit: 2019-09-09 | Discharge: 2019-09-10 | Disposition: A | Discharge status: Home / Self Care | Payer: Self-pay | Attending: Emergency Medicine | Admitting: Emergency Medicine

## 2019-09-09 ENCOUNTER — Other Ambulatory Visit: Payer: Self-pay

## 2019-09-09 ENCOUNTER — Ambulatory Visit (HOSPITAL_COMMUNITY)
Admission: EM | Admit: 2019-09-09 | Discharge: 2019-09-09 | Disposition: A | Discharge status: Home / Self Care | Payer: No Typology Code available for payment source

## 2019-09-09 ENCOUNTER — Encounter (HOSPITAL_COMMUNITY): Payer: Self-pay | Admitting: Emergency Medicine

## 2019-09-09 DIAGNOSIS — I80201 Phlebitis and thrombophlebitis of unspecified deep vessels of right lower extremity: Secondary | ICD-10-CM

## 2019-09-09 DIAGNOSIS — R58 Hemorrhage, not elsewhere classified: Secondary | ICD-10-CM

## 2019-09-09 DIAGNOSIS — M79604 Pain in right leg: Secondary | ICD-10-CM | POA: Insufficient documentation

## 2019-09-09 DIAGNOSIS — R0789 Other chest pain: Secondary | ICD-10-CM | POA: Insufficient documentation

## 2019-09-09 DIAGNOSIS — Z862 Personal history of diseases of the blood and blood-forming organs and certain disorders involving the immune mechanism: Secondary | ICD-10-CM

## 2019-09-09 DIAGNOSIS — I10 Essential (primary) hypertension: Secondary | ICD-10-CM | POA: Insufficient documentation

## 2019-09-09 DIAGNOSIS — R233 Spontaneous ecchymoses: Secondary | ICD-10-CM | POA: Insufficient documentation

## 2019-09-09 DIAGNOSIS — Z87891 Personal history of nicotine dependence: Secondary | ICD-10-CM | POA: Insufficient documentation

## 2019-09-09 LAB — BASIC METABOLIC PANEL
Anion gap: 7 (ref 5–15)
BUN: 10 mg/dL (ref 6–20)
CO2: 26 mmol/L (ref 22–32)
Calcium: 9.4 mg/dL (ref 8.9–10.3)
Chloride: 107 mmol/L (ref 98–111)
Creatinine, Ser: 0.76 mg/dL (ref 0.44–1.00)
GFR calc Af Amer: >60 mL/min (ref >60)
GFR calc non Af Amer: >60 mL/min (ref >60)
Glucose, Bld: 110 mg/dL — ABNORMAL HIGH (ref 70–99)
Potassium: 4.4 mmol/L (ref 3.5–5.1)
Sodium: 140 mmol/L (ref 135–145)

## 2019-09-09 LAB — TROPONIN I (HIGH SENSITIVITY): Troponin I (High Sensitivity): 5 ng/L (ref <18)

## 2019-09-09 LAB — CBC
HCT: 41.2 % (ref 36.0–46.0)
Hemoglobin: 13.3 g/dL (ref 12.0–15.0)
MCH: 30.2 pg (ref 26.0–34.0)
MCHC: 32.3 g/dL (ref 30.0–36.0)
MCV: 93.6 fL (ref 80.0–100.0)
Platelets: 293 10*3/uL (ref 150–400)
RBC: 4.4 MIL/uL (ref 3.87–5.11)
RDW: 13 % (ref 11.5–15.5)
WBC: 8.4 10*3/uL (ref 4.0–10.5)
nRBC: 0 % (ref 0.0–0.2)

## 2019-09-09 LAB — PROTIME-INR
INR: 0.8 (ref 0.8–1.2)
Prothrombin Time: 11.2 seconds — ABNORMAL LOW (ref 11.4–15.2)

## 2019-09-09 LAB — I-STAT BETA HCG BLOOD, ED (MC, WL, AP ONLY): I-stat hCG, quantitative: <5 m[IU]/mL (ref <5)

## 2019-09-09 MED ORDER — SODIUM CHLORIDE 0.9% FLUSH
3.0000 mL | Freq: Once | INTRAVENOUS | Status: AC
  Administered 2019-09-10: 3 mL via INTRAVENOUS

## 2019-09-09 NOTE — ED Triage Notes (Signed)
Using an interpretor pt reports she was sent by urgent care for labwork and imaging.  She is experiencing unexplained bruising, right lower leg pain and now chest pain.  Hx of blood clots but has been taken off blood thinners.

## 2019-09-09 NOTE — ED Triage Notes (Addendum)
Pt is here with unexplained bruising all over her arms & right leg that started Friday, pt has not taken any meds to relieve discomfort.

## 2019-09-09 NOTE — ED Notes (Signed)
Pt's daughter  [phone]

## 2019-09-09 NOTE — ED Provider Notes (Signed)
[facility]   MRN: [id number] DOB: [birth year]  Subjective:   Angela Ruiz is a 49 y.o. female presenting for acute onset of bruising and knots over extremities.  Patient states that she has a history of blood clots, this is a remote history but was on anticoagulation therapy for 6 months.  She cannot recall the name of the medication but was taken off after this and had consistent monitoring of her blood work.  Lately she has been exercising strenuously doing a lot of running, weightlifting.  Denies fever, chest pain, shortness of breath, abdominal pain.  Has a history of anemia as well.  No current facility-administered medications for this encounter. No current outpatient medications on file.   Allergies  Allergen Reactions  . Eggs Or Egg-Derived Products Nausea And Vomiting    Past Medical History:  Diagnosis Date  . Anemia   . Headache   . Hypertension      Past Surgical History:  Procedure Laterality Date  . BILATERAL SALPINGECTOMY Bilateral 03/15/2015   Procedure: BILATERAL SALPINGECTOMY;  Surgeon: Woodroe Mode, MD;  Location: [facility];  Service: Gynecology;  Laterality: Bilateral;  . CESAREAN SECTION     2007  . SUPRACERVICAL ABDOMINAL HYSTERECTOMY N/A 03/15/2015   Procedure: HYSTERECTOMY SUPRACERVICAL ABDOMINAL;  Surgeon: Woodroe Mode, MD;  Location: [facility];  Service: Gynecology;  Laterality: N/A;    Family History  Problem Relation Age of Onset  . Hypertension Mother   . Colitis Mother   . Alcohol abuse Neg Hx   . Arthritis Neg Hx   . Asthma Neg Hx   . Birth defects Neg Hx   . Cancer Neg Hx   . COPD Neg Hx   . Depression Neg Hx   . Diabetes Neg Hx   . Drug abuse Neg Hx   . Early death Neg Hx   . Hearing loss Neg Hx   . Heart disease Neg Hx   . Hyperlipidemia Neg Hx   . Kidney disease Neg Hx   . Learning disabilities Neg Hx   . Mental illness Neg Hx   . Mental retardation Neg Hx   . Miscarriages / Stillbirths Neg Hx   . Stroke Neg Hx    . Vision loss Neg Hx   . Varicose Veins Neg Hx   . Breast cancer Neg Hx     Social History   Tobacco Use  . Smoking status: Former Smoker    Quit date: 07/14/2003    Years since quitting: 16.1  . Smokeless tobacco: Never Used  Substance Use Topics  . Alcohol use: No  . Drug use: No    ROS   Objective:   Vitals: BP 133/84 (BP Location: Left Arm)   Pulse 75   Temp 98.4 F (36.9 C) (Oral)   Resp 18   Wt 163 lb 9.6 oz (74.2 kg)   LMP 03/09/2015   SpO2 99%   BMI 33.04 kg/m   Physical Exam Constitutional:      General: She is not in acute distress.    Appearance: Normal appearance. She is well-developed. She is not ill-appearing, toxic-appearing or diaphoretic.  HENT:     Head: Normocephalic and atraumatic.     Nose: Nose normal.     Mouth/Throat:     Mouth: Mucous membranes are moist.  Eyes:     Extraocular Movements: Extraocular movements intact.     Pupils: Pupils are equal, round, and reactive to light.  Cardiovascular:     Rate  and Rhythm: Normal rate and regular rhythm.     Pulses: Normal pulses.     Heart sounds: Normal heart sounds. No murmur. No friction rub. No gallop.   Pulmonary:     Effort: Pulmonary effort is normal. No respiratory distress.     Breath sounds: Normal breath sounds. No stridor. No wheezing, rhonchi or rales.  Musculoskeletal:        General: No tenderness.     Right lower leg: No edema.     Left lower leg: No edema.     Comments: Negative Homan sign bilaterally.  Skin:    General: Skin is warm and dry.     Findings: Bruising (multiple scattered areas of ecchymosis over extremities; left forearm has tender nodule associated with area of ecchymosis) present. No rash.  Neurological:     Mental Status: She is alert and oriented to person, place, and time.  Psychiatric:        Mood and Affect: Mood normal.        Behavior: Behavior normal.        Thought Content: Thought content normal.        Judgment: Judgment normal.       Assessment and Plan :   1. Ecchymosis on examination   2. History of anemia   3. 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  Differential does include recurrent deep vein thrombophlebitis, possible blood clots, thrombocytopenia.  Low suspicion for chest PE.  However, prefers to have work-up to rule these differential out.  Counseled that this could be done through the emergency room as we do not have the ultrasound or chest CT scan.  Patient and her daughter will report there now.   Jaynee Eagles, [place] 09/09/19 1955

## 2019-09-09 NOTE — Discharge Instructions (Signed)
Please report to the ER for labs and imaging such as an ultrasound or CT scan of your chest to rule out dvt, severe thrombocytopenia.

## 2019-09-10 ENCOUNTER — Emergency Department (HOSPITAL_COMMUNITY): Payer: Self-pay

## 2019-09-10 ENCOUNTER — Ambulatory Visit (HOSPITAL_BASED_OUTPATIENT_CLINIC_OR_DEPARTMENT_OTHER)
Admission: RE | Admit: 2019-09-10 | Discharge: 2019-09-10 | Disposition: A | Discharge status: Home / Self Care | Payer: Self-pay | Source: Ambulatory Visit | Attending: Emergency Medicine | Admitting: Emergency Medicine

## 2019-09-10 ENCOUNTER — Encounter (HOSPITAL_COMMUNITY): Payer: Self-pay | Admitting: Radiology

## 2019-09-10 DIAGNOSIS — M79609 Pain in unspecified limb: Secondary | ICD-10-CM

## 2019-09-10 DIAGNOSIS — M7989 Other specified soft tissue disorders: Secondary | ICD-10-CM

## 2019-09-10 LAB — TROPONIN I (HIGH SENSITIVITY): Troponin I (High Sensitivity): 4 ng/L (ref <18)

## 2019-09-10 MED ORDER — IOHEXOL 350 MG/ML SOLN
75.0000 mL | Freq: Once | INTRAVENOUS | Status: AC | PRN
  Administered 2019-09-10: 75 mL via INTRAVENOUS

## 2019-09-10 NOTE — ED Provider Notes (Signed)
TIME SEEN: 3:10 AM  CHIEF COMPLAINT: Easy bruising  HPI: Patient is a 49 y.o. F who presents to ED with bruising to both arms and right leg since Friday morning.  No injury, trauma.  No recent blood draws, IV sticks.  Complains of feeling they are swollen and a little sore.  States she feels a "knot" underneath the bruise of the left upper extremity.  Having "a little" chest pain in left side that she describes as "if I was hit."  No pressure.  No SOB.  No nausea, vomiting, sweating, dizziness.  No A/A factors.  Never has chest pain with exercising.  Has been exercising more since September 2020 - cardio, weight lifting and Zumba.  She states she thinks this could be the cause of her easy bruising.  No vaginal or rectal bleeding.  No epistaxis.  Has had some gum bleeding but this is only with brushing her teeth.  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  No h/o PE.  Off anticoagulation, 13 years.  Was on for 6 months.  Had just had a baby and was in the hospital when her DVT was diagnosed.  She does feel like her symptoms today are similar to when she had a DVT.  No recent surgery, fracture, long travel, hospitalization, trauma.  Not on birth control or hormone therapy.  Sent from Urgent Care.  Spanish interpretor used.    ROS: See HPI Constitutional: no fever  Eyes: no drainage  ENT: no runny nose   Cardiovascular:   chest pain  Resp: no SOB  GI: no vomiting GU: no dysuria Integumentary: no rash  Allergy: no hives  Musculoskeletal: no leg swelling  Neurological: no slurred speech ROS otherwise negative  PAST MEDICAL HISTORY/PAST SURGICAL HISTORY:  Past Medical History:  Diagnosis Date  . Anemia   . Headache   . Hypertension     MEDICATIONS:  Prior to Admission medications   Not on File    ALLERGIES:  Allergies  Allergen Reactions  . Eggs Or Egg-Derived Products Nausea And Vomiting    SOCIAL HISTORY:  Social History   Tobacco Use  . Smoking status: Former Smoker   Quit date: 07/14/2003    Years since quitting: 16.1  . Smokeless tobacco: Never Used  Substance Use Topics  . Alcohol use: No    FAMILY HISTORY: Family History  Problem Relation Age of Onset  . Healthy Father   . Hypertension Mother   . Colitis Mother   . Alcohol abuse Neg Hx   . Arthritis Neg Hx   . Asthma Neg Hx   . Birth defects Neg Hx   . Cancer Neg Hx   . COPD Neg Hx   . Depression Neg Hx   . Diabetes Neg Hx   . Drug abuse Neg Hx   . Early death Neg Hx   . Hearing loss Neg Hx   . Heart disease Neg Hx   . Hyperlipidemia Neg Hx   . Kidney disease Neg Hx   . Learning disabilities Neg Hx   . Mental illness Neg Hx   . Mental retardation Neg Hx   . Miscarriages / Stillbirths Neg Hx   . Stroke Neg Hx   . Vision loss Neg Hx   . Varicose Veins Neg Hx   . Breast cancer Neg Hx     EXAM: BP 128/90 (BP Location: Right Arm)   Pulse 65   Temp 98.4 F (36.9 C) (Oral)   Resp  16   Ht 4\' 11"  (1.499 m)   Wt 73.9 kg   LMP 03/09/2015   SpO2 98%   BMI 32.92 kg/m  CONSTITUTIONAL: Alert and oriented and responds appropriately to questions. Well-appearing; well-nourished HEAD: Normocephalic EYES: Conjunctivae clear, pupils appear equal, EOM appear intact ENT: normal nose; moist mucous membranes NECK: Supple, normal ROM CARD: RRR; S1 and S2 appreciated; no murmurs, no clicks, no rubs, no gallops RESP: Normal chest excursion without splinting or tachypnea; breath sounds clear and equal bilaterally; no wheezes, no rhonchi, no rales, no hypoxia or respiratory distress, speaking full sentences ABD/GI: Normal bowel sounds; non-distended; soft, non-tender, no rebound, no guarding, no peritoneal signs, no hepatosplenomegaly BACK:  The back appears normal EXT: Normal ROM in all joints; no deformity noted, no edema; no cyanosis, scattered areas of ecchymosis to the bilateral forearms and right calf, no asymmetry noted to the lower extremities, no significant calf tenderness on exam, no  joint effusions, no redness or warmth, compartments are soft, extremities warm and well-perfused SKIN: Normal color for age and race; warm; no rash on exposed skin, no blisters or desquamation, no signs of erythema nodosum, no petechiae or purpura, no urticaria NEURO: Moves all extremities equally PSYCH: The patient's mood and manner are appropriate.   MEDICAL DECISION MAKING: Patient here with scattered ecchymosis.  Normal platelet count.  She is not anemic.  Normal INR.  No history of bleeding disorder.  Not currently on antiplatelets or anticoagulants.  Also having some mild chest pain.  Seems atypical for ACS especially given she is able to workout strenuously and never develops chest pain.  She has had 2 normal troponins, clear chest x-ray and EKG without ischemic abnormality.Marland Kitchen  Unfortunately by the time she arrived to the emergency department, ultrasonographer was already gone for the evening.  She states she is okay with the plan to come back later today for ultrasound of the right lower extremity.  Will obtain CTA of the chest to rule out PE.  ED PROGRESS: CTA of the chest shows no acute abnormality.  I feel she is safe to be discharged home.  She will follow-up in the morning for an ultrasound of her right lower extremity.  Will hold on giving a dose of anticoagulation here given these areas of bruising.  Recommended continued follow-up with her primary care doctor.  At this time, I do not feel there is any life-threatening condition present. I have reviewed, interpreted and discussed all results (EKG, imaging, lab, urine as appropriate) and exam findings with patient/family. I have reviewed nursing notes and appropriate previous records.  I feel the patient is safe to be discharged home without further emergent workup and can continue workup as an outpatient as needed. Discussed usual and customary return precautions. Patient/family verbalize understanding and are comfortable with this plan.   Outpatient follow-up has been provided as needed. All questions have been answered.    EKG Interpretation  Date/Time:  Tuesday September 09 2019 21:19:16 EST Ventricular Rate:  65 PR Interval:  126 QRS Duration: 86 QT Interval:  412 QTC Calculation: 428 R Axis:   66 Text Interpretation: Normal sinus rhythm Normal ECG No significant change since last tracing Confirmed by Cagney Steenson, Cyril Mourning [phone]) on 09/10/2019 3:02:19 AM         Right leg.    Right arm.    Left arm.   Patient gave verbal permission to utilize photo for medical documentation only. The image was not stored on any personal device.   Angela Frederickson  Ruiz was evaluated in Emergency Department on 09/10/2019 for the symptoms described in the history of present illness. She was evaluated in the context of the global COVID-19 pandemic, which necessitated consideration that the patient might be at risk for infection with the SARS-CoV-2 virus that causes COVID-19. Institutional protocols and algorithms that pertain to the evaluation of patients at risk for COVID-19 are in a state of rapid change based on information released by regulatory bodies including the CDC and federal and state organizations. These policies and algorithms were followed during the patient's care in the ED.  Patient was seen wearing N95, face shield, gloves.    Diana Davenport, Delice Bison, DO 09/10/19 0800

## 2019-09-10 NOTE — ED Notes (Signed)
Patient verbalizes understanding of discharge instructions. Opportunity for questioning and answers were provided. Armband removed by staff, pt discharged from ED.  

## 2019-09-10 NOTE — Discharge Instructions (Addendum)
Your cardiac labs, EKG, chest x-ray and CT scan were normal today.  I recommend that you return later this morning as instructed on these discharge papers for an ultrasound of your right leg to rule out DVT (blood clot).  You may take Tylenol 1000 mg every 6 hours as needed for pain.  Recommend close follow-up with your primary care physician.

## 2019-09-10 NOTE — Progress Notes (Signed)
Right lower extremity venous duplex completed. Refer to "CV Proc" under chart review to view preliminary results.  09/10/2019 12:21 PM Kelby Aline., MHA, RVT, RDCS, RDMS

## 2021-02-25 ENCOUNTER — Other Ambulatory Visit: Payer: Self-pay | Admitting: Obstetrics and Gynecology

## 2021-02-25 DIAGNOSIS — Z1231 Encounter for screening mammogram for malignant neoplasm of breast: Secondary | ICD-10-CM

## 2021-03-08 ENCOUNTER — Other Ambulatory Visit: Payer: Self-pay | Admitting: Obstetrics and Gynecology

## 2021-03-08 DIAGNOSIS — Z1231 Encounter for screening mammogram for malignant neoplasm of breast: Secondary | ICD-10-CM

## 2021-04-28 ENCOUNTER — Inpatient Hospital Stay: Admission: RE | Admit: 2021-04-28 | Payer: Self-pay | Source: Ambulatory Visit

## 2021-04-28 ENCOUNTER — Ambulatory Visit: Payer: Self-pay

## 2024-01-07 ENCOUNTER — Encounter (HOSPITAL_COMMUNITY): Payer: Self-pay

## 2024-01-07 ENCOUNTER — Emergency Department (HOSPITAL_COMMUNITY): Payer: Self-pay

## 2024-01-07 ENCOUNTER — Other Ambulatory Visit: Payer: Self-pay

## 2024-01-07 ENCOUNTER — Emergency Department (HOSPITAL_COMMUNITY)
Admission: EM | Admit: 2024-01-07 | Discharge: 2024-01-07 | Disposition: A | Discharge status: Home / Self Care | Payer: Self-pay

## 2024-01-07 DIAGNOSIS — R109 Unspecified abdominal pain: Secondary | ICD-10-CM | POA: Insufficient documentation

## 2024-01-07 DIAGNOSIS — H9203 Otalgia, bilateral: Secondary | ICD-10-CM | POA: Insufficient documentation

## 2024-01-07 DIAGNOSIS — R0789 Other chest pain: Secondary | ICD-10-CM | POA: Insufficient documentation

## 2024-01-07 DIAGNOSIS — R112 Nausea with vomiting, unspecified: Secondary | ICD-10-CM | POA: Insufficient documentation

## 2024-01-07 DIAGNOSIS — R519 Headache, unspecified: Secondary | ICD-10-CM | POA: Insufficient documentation

## 2024-01-07 LAB — TROPONIN I (HIGH SENSITIVITY)
Troponin I (High Sensitivity): 9 ng/L (ref <18)
Troponin I (High Sensitivity): 8 ng/L (ref <18)

## 2024-01-07 LAB — HEPATIC FUNCTION PANEL
ALT: 28 U/L (ref 0–44)
AST: 23 U/L (ref 15–41)
Albumin: 4 g/dL (ref 3.5–5.0)
Alkaline Phosphatase: 71 U/L (ref 38–126)
Bilirubin, Direct: <0.1 mg/dL (ref 0.0–0.2)
Total Bilirubin: 0.7 mg/dL (ref 0.0–1.2)
Total Protein: 7.5 g/dL (ref 6.5–8.1)

## 2024-01-07 LAB — CBC
HCT: 42 % (ref 36.0–46.0)
Hemoglobin: 14.1 g/dL (ref 12.0–15.0)
MCH: 29.6 pg (ref 26.0–34.0)
MCHC: 33.6 g/dL (ref 30.0–36.0)
MCV: 88.1 fL (ref 80.0–100.0)
Platelets: 320 K/uL (ref 150–400)
RBC: 4.77 MIL/uL (ref 3.87–5.11)
RDW: 12.2 % (ref 11.5–15.5)
WBC: 11.5 K/uL — ABNORMAL HIGH (ref 4.0–10.5)
nRBC: 0 % (ref 0.0–0.2)

## 2024-01-07 LAB — BASIC METABOLIC PANEL WITH GFR
Anion gap: 14 (ref 5–15)
BUN: 12 mg/dL (ref 6–20)
CO2: 25 mmol/L (ref 22–32)
Calcium: 9.7 mg/dL (ref 8.9–10.3)
Chloride: 100 mmol/L (ref 98–111)
Creatinine, Ser: 0.65 mg/dL (ref 0.44–1.00)
GFR, Estimated: >60 mL/min (ref >60)
Glucose, Bld: 127 mg/dL — ABNORMAL HIGH (ref 70–99)
Potassium: 4.2 mmol/L (ref 3.5–5.1)
Sodium: 139 mmol/L (ref 135–145)

## 2024-01-07 LAB — LIPASE, BLOOD: Lipase: 24 U/L (ref 11–51)

## 2024-01-07 MED ORDER — DIPHENHYDRAMINE HCL 25 MG PO CAPS
25.0000 mg | ORAL_CAPSULE | Freq: Once | ORAL | Status: AC
  Administered 2024-01-07: 25 mg via ORAL
  Filled 2024-01-07: qty 1

## 2024-01-07 MED ORDER — KETOROLAC TROMETHAMINE 15 MG/ML IJ SOLN
15.0000 mg | Freq: Once | INTRAMUSCULAR | Status: AC
  Administered 2024-01-07: 15 mg via INTRAVENOUS
  Filled 2024-01-07: qty 1

## 2024-01-07 MED ORDER — MAGNESIUM SULFATE 2 GM/50ML IV SOLN
2.0000 g | Freq: Once | INTRAVENOUS | Status: AC
  Administered 2024-01-07: 2 g via INTRAVENOUS
  Filled 2024-01-07: qty 50

## 2024-01-07 MED ORDER — METOCLOPRAMIDE HCL 10 MG PO TABS: 10.0000 mg | ORAL_TABLET | Freq: Four times a day (QID) | ORAL | 0 refills | Status: AC

## 2024-01-07 MED ORDER — LACTATED RINGERS IV BOLUS
1000.0000 mL | Freq: Once | INTRAVENOUS | Status: AC
  Administered 2024-01-07: 1000 mL via INTRAVENOUS

## 2024-01-07 MED ORDER — METOCLOPRAMIDE HCL 5 MG/ML IJ SOLN
10.0000 mg | Freq: Once | INTRAMUSCULAR | Status: AC
  Administered 2024-01-07: 10 mg via INTRAVENOUS
  Filled 2024-01-07: qty 2

## 2024-01-07 MED ORDER — DEXAMETHASONE SODIUM PHOSPHATE 10 MG/ML IJ SOLN
8.0000 mg | Freq: Once | INTRAMUSCULAR | Status: AC
  Administered 2024-01-07: 8 mg via INTRAVENOUS
  Filled 2024-01-07: qty 1

## 2024-01-07 NOTE — ED Provider Notes (Signed)
 Angela Ruiz EMERGENCY DEPARTMENT AT [facility] Provider Note   CSN: [id number] Arrival date & time: 01/07/24  1109     Patient presents with: Chest Pain   Angela Ruiz is a 53 y.o. female.    Chest Pain  Patient is a 53 year old female present emergency room today with complaints of chest pain, headache, nausea, vomiting, bilateral ear pain  She states she has had some degree of chest pressure for the past 2 days, headache and then developed nausea and vomiting this morning and has had several episodes.  She states that the chest pain is worse when she is vomiting.  She describes it as nonradiating not associated with breathing or position.  She denies any fevers.  Does endorse some abdominal pain no syncope or near syncope.  Not on any anticoagulation, no head injuries.     Prior to Admission medications   Not on File    Allergies: Egg-derived products    Review of Systems  Cardiovascular:  Positive for chest pain.    Updated Vital Signs BP (!) 143/95   Pulse 74   Temp (!) 97.5 F (36.4 C) (Oral)   Resp 17   Ht 4' 11 (1.499 m)   LMP 03/09/2015   SpO2 99%   BMI 32.92 kg/m   Physical Exam Vitals and nursing note reviewed.  Constitutional:      General: She is not in acute distress.    Appearance: She is obese.  HENT:     Head: Normocephalic and atraumatic.     Right Ear: Tympanic membrane normal.     Left Ear: Tympanic membrane normal.     Ears:     Comments: EACs without obstruction, TMs without erythema.    Nose: Nose normal.     Mouth/Throat:     Mouth: Mucous membranes are moist.  Eyes:     General: No scleral icterus. Cardiovascular:     Rate and Rhythm: Normal rate and regular rhythm.     Pulses: Normal pulses.     Heart sounds: Normal heart sounds.  Pulmonary:     Effort: Pulmonary effort is normal. No respiratory distress.     Breath sounds: No wheezing.  Abdominal:     Palpations: Abdomen is soft.     Tenderness: There  is no abdominal tenderness. There is no guarding or rebound.  Musculoskeletal:     Cervical back: Normal range of motion and neck supple.     Right lower leg: No edema.     Left lower leg: No edema.  Skin:    General: Skin is warm and dry.     Capillary Refill: Capillary refill takes less than 2 seconds.  Neurological:     Mental Status: She is alert. Mental status is at baseline.     Comments: Smile symmetric, moves all 4 extremities, speech is clear with no dysarthria.  All 4 extremities with normal sensation  Psychiatric:        Mood and Affect: Mood normal.        Behavior: Behavior normal.     (all labs ordered are listed, but only abnormal results are displayed) Labs Reviewed  BASIC METABOLIC PANEL WITH GFR - Abnormal; Notable for the following components:      Result Value   Glucose, Bld 127 (*)    All other components within normal limits  CBC - Abnormal; Notable for the following components:   WBC 11.5 (*)    All other components within normal limits  HEPATIC FUNCTION PANEL  LIPASE, BLOOD  TROPONIN I (HIGH SENSITIVITY)  TROPONIN I (HIGH SENSITIVITY)    EKG: None  Radiology: CT Head Wo Contrast Result Date: 01/07/2024 CLINICAL DATA:  Provided history: Headache, no red flags. EXAM: CT HEAD WITHOUT CONTRAST TECHNIQUE: Contiguous axial images were obtained from the base of the skull through the vertex without intravenous contrast. RADIATION DOSE REDUCTION: This exam was performed according to the departmental dose-optimization program which includes automated exposure control, adjustment of the mA and/or kV according to patient size and/or use of iterative reconstruction technique. COMPARISON:  Head CT 02/12/2018. FINDINGS: Brain: Cerebral volume is normal. There is no acute intracranial hemorrhage. No demarcated cortical infarct. No extra-axial fluid collection. No evidence of an intracranial mass. No midline shift. Vascular: No hyperdense vessel. Skull: No calvarial  fracture or aggressive osseous lesion. Sinuses/Orbits: No mass or acute finding within the imaged orbits. Trace mucosal thickening within the right maxillary sinus at the imaged levels. IMPRESSION: No evidence of an acute intracranial abnormality. Electronically Signed   By: Rockey Childs D.O.   On: 01/07/2024 14:47   DG Chest 2 View Result Date: 01/07/2024 CLINICAL DATA:  Chest pain. EXAM: CHEST - 2 VIEW COMPARISON:  Chest radiograph dated 09/09/2019. FINDINGS: The lungs are clear. There is no pleural effusion pneumothorax. The cardiac silhouette is within normal limits. No acute osseous pathology. IMPRESSION: No active cardiopulmonary disease. Electronically Signed   By: Vanetta Chou M.D.   On: 01/07/2024 12:11     Procedures   Medications Ordered in the ED  magnesium  sulfate IVPB 2 g 50 mL (has no administration in time range)  metoCLOPramide  (REGLAN ) injection 10 mg (10 mg Intravenous Given 01/07/24 1245)  diphenhydrAMINE  (BENADRYL ) capsule 25 mg (25 mg Oral Given 01/07/24 1244)  lactated ringers  bolus 1,000 mL (1,000 mLs Intravenous New Bag/Given 01/07/24 1247)  dexamethasone  (DECADRON ) injection 8 mg (8 mg Intravenous Given 01/07/24 1448)    Clinical Course as of 01/07/24 1450  Mon Jan 07, 2024  1228 Two days of chest pressure, headache, nausea and vomiting. CP worse with vomiting. Non-radiating. No fevers.  Some abd pain.  [WF]  1232 No head injuries.   [WF]    Clinical Course User Index [WF] Neldon Hamp RAMAN, [place]                                 Medical Decision Making Amount and/or Complexity of Data Reviewed Labs: ordered. Radiology: ordered.  Risk Prescription drug management.   This patient presents to the ED for concern of headache, chest pain, nausea, vomiting, this involves a number of treatment options, and is a complaint that carries with it a moderate to high risk of complications and morbidity. A differential diagnosis was considered for the patient's symptoms which  is discussed below:   Low likelihood of ACS, low suspicion for PE, dissection, PTX, pneumonia chest discomfort does seem to be associated with vomiting although did precede this.  Some concern for intracranial hemorrhage as cause of headache and nausea vomiting.  Will obtain CT imaging.  No head trauma.   Co morbidities: Discussed in HPI   Brief History:  Patient is a 53 year old female present emergency room today with complaints of chest pain, headache, nausea, vomiting, bilateral ear pain  She states she has had some degree of chest pressure for the past 2 days, headache and then developed nausea and vomiting this morning and has had several episodes.  She states that the chest pain is worse when she is vomiting.  She describes it as nonradiating not associated with breathing or position.  She denies any fevers.  Does endorse some abdominal pain no syncope or near syncope.  Not on any anticoagulation, no head injuries.    EMR reviewed including pt PMHx, past surgical history and past visits to ER.   See HPI for more details   Lab Tests:  I ordered and independently interpreted labs. Labs notable for Troponin x 2 within normal limits lipase normal LFTs unremarkable CBC with marginal leukocytosis likely secondary to patient vomiting.  BMP without creatinine abnormality or other abnormal finding  Imaging Studies:  Chest x-ray unremarkable CT head without hemorrhage or other acute abnormal finding    Cardiac Monitoring:  The patient was maintained on a cardiac monitor.  I personally viewed and interpreted the cardiac monitored which showed an underlying rhythm of: NSR EKG non-ischemic significant artifact present, normal intervals, no ST elevations, normal axis.   Medicines ordered:  I ordered medication including lactated Ringer 's, magnesium , Decadron , Reglan , Benadryl , Toradol  for headache Reevaluation of the patient after these medicines showed that the patient  improved I have reviewed the patients home medicines and have made adjustments as needed   Critical Interventions:     Consults/Attending Physician      Reevaluation:  After the interventions noted above I re-evaluated patient and found that they have :improved   Social Determinants of Health:      Problem List / ED Course:  Patient's primary complaint today is headache she has some nausea the headache was gradual onset frontal/top of head.  No blurred vision or double vision and she is neurologically intact.  Chest pain she describes as sternal nonradiating not exertional she denies any shortness of breath and states that the pain is worse with vomiting.  She denies any history of heart disease for her or any first-degree relatives. Feels improved after medications for headache here in the emergency department.  Will discharge home with follow-up with primary care.  She has not followed by primary care provider will give her information for the [facility] wellness clinic.   Dispostion:  After consideration of the diagnostic results and the patients response to treatment, I feel that the patent would benefit from outpatient follow-up   Final diagnoses:  None    ED Discharge Orders     None          Neldon Hamp RAMAN, [place] 01/07/24 1622    Neysa Caron PARAS, DO 01/10/24 1508

## 2024-01-07 NOTE — ED Provider Notes (Incomplete)
  Physical Exam  BP (!) 143/95   Pulse 74   Temp (!) 97.5 F (36.4 C) (Oral)   Resp 17   Ht 4' 11 (1.499 m)   LMP 03/09/2015   SpO2 99%   BMI 32.92 kg/m   Physical Exam  Procedures  Procedures  ED Course / MDM   Clinical Course as of 01/07/24 1457  Mon Jan 07, 2024  1228 Two days of chest pressure, headache, nausea and vomiting. CP worse with vomiting. Non-radiating. No fevers.  Some abd pain.  [WF]  1232 No head injuries.   [WF]    Clinical Course User Index [WF] Neldon Hamp RAMAN, [place]   Medical Decision Making Amount and/or Complexity of Data Reviewed Labs: ordered. Radiology: ordered.  Risk Prescription drug management.   ***

## 2024-01-07 NOTE — ED Triage Notes (Addendum)
 Pt came in via POV d/t HA the past 2 days that causes her eyes & ears to hurt & central CP that started yesterday, denies radiation of pain but does endorse twice she has vomited & when she does her Rt hand cramps. A/Ox4, rates her pain 10/10 during triage. Does endorse she has been under a lot of stress & this is keeping her from being able to sleep.

## 2024-01-07 NOTE — Discharge Instructions (Addendum)
 Please follow-up with the [place].  Call today to make an appointment.  Your heart workup today is all reassuring. Take Reglan  as prescribed as needed for headache.  May alternate Tylenol  and ibuprofen  as discussed below.  Make sure you are drinking plenty of water, do not drink any alcohol, do not smoke tobacco or use any recreational drugs.  I have written you a work note.  Return to the emergency room for any new or concerning symptoms as we discussed.
